# Patient Record
Sex: Female | Born: 1980 | Race: Black or African American | Hispanic: No | Marital: Single | Smoking: Never smoker
Health system: Southern US, Community
[De-identification: ages and names within clinical notes are randomized; demographics above are authoritative.]

## PROBLEM LIST (undated history)

## (undated) DIAGNOSIS — F32A Depression, unspecified: Secondary | ICD-10-CM

## (undated) DIAGNOSIS — N83209 Unspecified ovarian cyst, unspecified side: Secondary | ICD-10-CM

## (undated) DIAGNOSIS — K589 Irritable bowel syndrome without diarrhea: Secondary | ICD-10-CM

## (undated) DIAGNOSIS — J189 Pneumonia, unspecified organism: Secondary | ICD-10-CM

## (undated) DIAGNOSIS — D573 Sickle-cell trait: Secondary | ICD-10-CM

## (undated) DIAGNOSIS — R110 Nausea: Secondary | ICD-10-CM

## (undated) DIAGNOSIS — R1110 Vomiting, unspecified: Secondary | ICD-10-CM

## (undated) DIAGNOSIS — D126 Benign neoplasm of colon, unspecified: Secondary | ICD-10-CM

## (undated) DIAGNOSIS — K2100 Gastro-esophageal reflux disease with esophagitis, without bleeding: Secondary | ICD-10-CM

## (undated) DIAGNOSIS — G8929 Other chronic pain: Secondary | ICD-10-CM

## (undated) HISTORY — DX: Irritable bowel syndrome, unspecified: K58.9

## (undated) HISTORY — PX: COLONOSCOPY: SHX174

## (undated) HISTORY — PX: ESOPHAGOGASTRODUODENOSCOPY: SHX1529

## (undated) HISTORY — PX: OVARIAN CYST SURGERY: SHX726

---

## 2011-06-17 ENCOUNTER — Emergency Department (HOSPITAL_COMMUNITY)
Admission: EM | Admit: 2011-06-17 | Discharge: 2011-06-18 | Disposition: A | Discharge status: Home / Self Care | Payer: Medicaid Other | Attending: Emergency Medicine | Admitting: Emergency Medicine

## 2011-06-17 ENCOUNTER — Encounter: Payer: Self-pay | Admitting: Emergency Medicine

## 2011-06-17 DIAGNOSIS — K089 Disorder of teeth and supporting structures, unspecified: Secondary | ICD-10-CM | POA: Insufficient documentation

## 2011-06-17 DIAGNOSIS — R51 Headache: Secondary | ICD-10-CM | POA: Insufficient documentation

## 2011-06-17 DIAGNOSIS — K047 Periapical abscess without sinus: Secondary | ICD-10-CM

## 2011-06-17 NOTE — ED Notes (Signed)
Pt states she has had a toothache (both upper and lower on the left side) for about 2 weeks.  Toothache is giving her a headache.  Has had facial swelling.  None noted now.  Has tried Motrin, tylenol, orajel with no relief.  Does not have a dentist at the moment.

## 2011-06-18 MED ORDER — PENICILLIN V POTASSIUM 500 MG PO TABS: 500.0000 mg | ORAL_TABLET | Freq: Four times a day (QID) | ORAL | Status: AC

## 2011-06-18 MED ORDER — HYDROCODONE-ACETAMINOPHEN 5-325 MG PO TABS: 1.0000 | ORAL_TABLET | Freq: Four times a day (QID) | ORAL | Status: AC | PRN

## 2011-06-18 NOTE — ED Provider Notes (Signed)
History     CSN: [id number] Arrival date & time: 06/17/2011  8:07 PM   First MD Initiated Contact with Patient 06/17/11 2346      Chief Complaint  Patient presents with  . Dental Pain  . Headache    (Consider location/radiation/quality/duration/timing/severity/associated sxs/prior treatment) HPI Comments: Patient reports left upper and left lower molar pain with associated headache x 2 weeks.  Pain is described as aching, exacerbated palpation.  Reports some mild throat discomfort.  No fevers, difficulty swallowing or breathing.  Pt does not currently have a dentist.    Patient is a 30 y.o. female presenting with tooth pain and headaches. The history is provided by the patient.  Dental PainThe primary symptoms include headaches.   Headache     History reviewed. No pertinent past medical history.  History reviewed. No pertinent past surgical history.  History reviewed. No pertinent family history.  History  Substance Use Topics  . Smoking status: Never Smoker   . Smokeless tobacco: Not on file  . Alcohol Use: No    OB History    Grav Para Term Preterm Abortions TAB SAB Ect Mult Living                  Review of Systems  Neurological: Positive for headaches.  All other systems reviewed and are negative.    Allergies  Review of patient's allergies indicates no known allergies.  Home Medications   Current Outpatient Rx  Name Route Sig Dispense Refill  . ACETAMINOPHEN 325 MG PO TABS Oral Take 650 mg by mouth every 6 (six) hours as needed.      . IBUPROFEN 200 MG PO TABS Oral Take 400 mg by mouth every 6 (six) hours as needed.        BP 129/74  Pulse 102  Temp(Src) 98.3 F (36.8 C) (Oral)  Resp 18  SpO2 100%  Physical Exam  Constitutional: She is oriented to person, place, and time. She appears well-developed and well-nourished.  HENT:  Head: Normocephalic and atraumatic. No trismus in the jaw.  Mouth/Throat: Uvula is midline and oropharynx is clear  and moist. Dental abscesses present. No uvula swelling. No posterior oropharyngeal edema or posterior oropharyngeal erythema.         Left gingiva adjacent to left upper 2nd molar tender to palpation.   Neck: Neck supple.  Pulmonary/Chest: Effort normal and breath sounds normal. No stridor. No respiratory distress. She has no wheezes. She has no rales.  Neurological: She is alert and oriented to person, place, and time.    ED Course  Procedures (including critical care time)  Labs Reviewed - No data to display No results found.   1. Dental abscess       MDM  Patient with dental pain, afebrile, airway patent.  Likely dental abscess.  Dental follow up given.          Dillard Cannon [place], [place] 06/18/11 [phone]

## 2011-06-18 NOTE — ED Provider Notes (Signed)
Medical screening examination/treatment/procedure(s) were performed by non-physician practitioner and as supervising physician I was immediately available for consultation/collaboration.  Katelynd Blauvelt M Anicia Leuthold, MD 06/18/11 0817 

## 2011-08-12 ENCOUNTER — Emergency Department (HOSPITAL_COMMUNITY)
Admission: EM | Admit: 2011-08-12 | Discharge: 2011-08-12 | Disposition: A | Discharge status: Home / Self Care | Payer: Medicaid Other | Attending: Emergency Medicine | Admitting: Emergency Medicine

## 2011-08-12 ENCOUNTER — Encounter (HOSPITAL_COMMUNITY): Payer: Self-pay | Admitting: Emergency Medicine

## 2011-08-12 ENCOUNTER — Other Ambulatory Visit: Payer: Self-pay

## 2011-08-12 DIAGNOSIS — R112 Nausea with vomiting, unspecified: Secondary | ICD-10-CM | POA: Insufficient documentation

## 2011-08-12 DIAGNOSIS — K2970 Gastritis, unspecified, without bleeding: Secondary | ICD-10-CM | POA: Insufficient documentation

## 2011-08-12 DIAGNOSIS — R1013 Epigastric pain: Secondary | ICD-10-CM | POA: Insufficient documentation

## 2011-08-12 DIAGNOSIS — R079 Chest pain, unspecified: Secondary | ICD-10-CM | POA: Insufficient documentation

## 2011-08-12 HISTORY — DX: Unspecified ovarian cyst, unspecified side: N83.209

## 2011-08-12 LAB — COMPREHENSIVE METABOLIC PANEL
ALT: 13 U/L (ref 0–35)
Alkaline Phosphatase: 92 U/L (ref 39–117)
BUN: 6 mg/dL (ref 6–23)
CO2: 20 mEq/L (ref 19–32)
Chloride: 105 mEq/L (ref 96–112)
GFR calc Af Amer: >90 mL/min (ref >90)
Glucose, Bld: 96 mg/dL (ref 70–99)
Potassium: 3.7 mEq/L (ref 3.5–5.1)
Sodium: 137 mEq/L (ref 135–145)
Total Bilirubin: 0.2 mg/dL — ABNORMAL LOW (ref 0.3–1.2)
Total Protein: 8.4 g/dL — ABNORMAL HIGH (ref 6.0–8.3)

## 2011-08-12 LAB — LIPASE, BLOOD: Lipase: 20 U/L (ref 11–59)

## 2011-08-12 MED ORDER — OMEPRAZOLE 20 MG PO CPDR: 20.0000 mg | DELAYED_RELEASE_CAPSULE | Freq: Every day | ORAL | Status: DC

## 2011-08-12 MED ORDER — FAMOTIDINE IN NACL 20-0.9 MG/50ML-% IV SOLN
20.0000 mg | Freq: Once | INTRAVENOUS | Status: AC
  Administered 2011-08-12: 20 mg via INTRAVENOUS
  Filled 2011-08-12: qty 50

## 2011-08-12 MED ORDER — SUCRALFATE 1 GM/10ML PO SUSP: 1.0000 g | Freq: Four times a day (QID) | ORAL | Status: AC | PRN

## 2011-08-12 MED ORDER — SODIUM CHLORIDE 0.9 % IV BOLUS (SEPSIS)
1000.0000 mL | Freq: Once | INTRAVENOUS | Status: AC
  Administered 2011-08-12: 1000 mL via INTRAVENOUS

## 2011-08-12 MED ORDER — GI COCKTAIL ~~LOC~~
30.0000 mL | Freq: Once | ORAL | Status: AC
  Administered 2011-08-12: 30 mL via ORAL
  Filled 2011-08-12: qty 30

## 2011-08-12 NOTE — ED Notes (Signed)
Patient stable upon discharge.  

## 2011-08-12 NOTE — ED Provider Notes (Signed)
History     CSN: [id number]  Arrival date & time 08/12/11  2012   First MD Initiated Contact with Patient 08/12/11 2049      Chief Complaint  Patient presents with  . Chest Pain    (Consider location/radiation/quality/duration/timing/severity/associated sxs/prior treatment) HPI This previously well young female presents with one day of epigastric pain she notes that she was incision noticed pain focally about her epigastrium the pain is nonradiating, burning/sharp she denies any pleuritic pain, any exertional pain she also notes emesis, with post emesis worsening of the pain. no confusion, no disorientation, no diarrhea, no fevers, no chills. She does have a history of heartburn, and notes that this is similar, though worse than prior episodes Past Medical History  Diagnosis Date  . Ovarian cyst     History reviewed. No pertinent past surgical history.  No family history on file.  History  Substance Use Topics  . Smoking status: Never Smoker   . Smokeless tobacco: Not on file  . Alcohol Use: No    OB History    Grav Para Term Preterm Abortions TAB SAB Ect Mult Living                  Review of Systems  Constitutional:       HPI  HENT:       HPI otherwise negative  Eyes: Negative.   Respiratory:       HPI, otherwise negative  Cardiovascular:       HPI, otherwise nmegative  Gastrointestinal: Positive for nausea and vomiting. Negative for diarrhea.  Genitourinary:       HPI, otherwise negative  Musculoskeletal:       HPI, otherwise negative  Skin: Negative.   Neurological: Negative for syncope.    Allergies  Review of patient's allergies indicates no known allergies.  Home Medications   Current Outpatient Rx  Name Route Sig Dispense Refill  . ACETAMINOPHEN 325 MG PO TABS Oral Take 650 mg by mouth every 6 (six) hours as needed.     . IBUPROFEN 200 MG PO TABS Oral Take 400 mg by mouth every 6 (six) hours as needed. Pain    . OMEPRAZOLE 20 MG PO CPDR Oral  Take 1 capsule (20 mg total) by mouth daily. 30 capsule 0  . SUCRALFATE 1 GM/10ML PO SUSP Oral Take 10 mLs (1 g total) by mouth every 6 (six) hours as needed. 420 mL 0    Wt 176 lb (79.833 kg)  LMP 08/05/2011  Physical Exam  Nursing note and vitals reviewed. Constitutional: She is oriented to person, place, and time. She appears well-developed and well-nourished. No distress.  HENT:  Head: Normocephalic and atraumatic.  Eyes: Conjunctivae and EOM are normal.  Cardiovascular: Normal rate and regular rhythm.   Pulmonary/Chest: Effort normal and breath sounds normal. No stridor. No respiratory distress.  Abdominal: She exhibits no distension.  Musculoskeletal: She exhibits no edema.  Neurological: She is alert and oriented to person, place, and time. No cranial nerve deficit.  Skin: Skin is warm and dry.  Psychiatric: She has a normal mood and affect.    ED Course  Procedures (including critical care time)  Labs Reviewed  COMPREHENSIVE METABOLIC PANEL - Abnormal; Notable for the following:    Total Protein 8.4 (*)    Total Bilirubin 0.2 (*)    All other components within normal limits  LIPASE, BLOOD   No results found.   1. Gastritis      Date: 08/12/2011  Rate: 91  Rhythm: normal sinus rhythm  QRS Axis: normal  Intervals: normal  ST/T Wave abnormalities: normal  Conduction Disutrbances:none  Narrative Interpretation:   Old EKG Reviewed: none available NORMAL ECG   MDM  This generally well 31 year old female presents with one day of epigastric pain the patient's description of a burning pain with vomiting, and subsequent worsening of the pain is suggestive of gastric etiology the patient's absence of cardiac risk factors, her generally benign appearance, her unremarkable ECT are all reassuring the patient will be discharged in stable condition to follow up with her primary care physician she was discharged on a PPI        Gerhard Munch, MD 08/12/11 2302

## 2011-08-12 NOTE — ED Notes (Signed)
Pt alert, nad, c/o chest pain, mid sternal, non radiating, worse with experation, resp even unlabored, emesis several times today, skin pwd, states pain same as when she had a cyst on her ovary

## 2011-12-29 ENCOUNTER — Encounter (HOSPITAL_COMMUNITY): Payer: Self-pay | Admitting: *Deleted

## 2011-12-29 ENCOUNTER — Emergency Department (HOSPITAL_COMMUNITY)
Admission: EM | Admit: 2011-12-29 | Discharge: 2011-12-30 | Disposition: A | Discharge status: Home / Self Care | Payer: Self-pay | Attending: Emergency Medicine | Admitting: Emergency Medicine

## 2011-12-29 DIAGNOSIS — M549 Dorsalgia, unspecified: Secondary | ICD-10-CM

## 2011-12-29 DIAGNOSIS — R109 Unspecified abdominal pain: Secondary | ICD-10-CM | POA: Insufficient documentation

## 2011-12-29 DIAGNOSIS — A499 Bacterial infection, unspecified: Secondary | ICD-10-CM | POA: Insufficient documentation

## 2011-12-29 DIAGNOSIS — N760 Acute vaginitis: Secondary | ICD-10-CM | POA: Insufficient documentation

## 2011-12-29 DIAGNOSIS — M545 Low back pain, unspecified: Secondary | ICD-10-CM | POA: Insufficient documentation

## 2011-12-29 DIAGNOSIS — B9689 Other specified bacterial agents as the cause of diseases classified elsewhere: Secondary | ICD-10-CM | POA: Insufficient documentation

## 2011-12-29 LAB — URINE MICROSCOPIC-ADD ON

## 2011-12-29 LAB — WET PREP, GENITAL: Yeast Wet Prep HPF POC: NONE SEEN

## 2011-12-29 LAB — URINALYSIS, ROUTINE W REFLEX MICROSCOPIC
Bilirubin Urine: NEGATIVE
Hgb urine dipstick: NEGATIVE
Ketones, ur: NEGATIVE mg/dL
Nitrite: NEGATIVE
Specific Gravity, Urine: 1.012 (ref 1.005–1.030)
Urobilinogen, UA: 0.2 mg/dL (ref 0.0–1.0)

## 2011-12-29 LAB — GLUCOSE, CAPILLARY: Glucose-Capillary: 90 mg/dL (ref 70–99)

## 2011-12-29 MED ORDER — HYDROCODONE-ACETAMINOPHEN 5-325 MG PO TABS
1.0000 | ORAL_TABLET | Freq: Once | ORAL | Status: AC
  Administered 2011-12-29: 1 via ORAL
  Filled 2011-12-29 (×2): qty 1

## 2011-12-29 MED ORDER — METRONIDAZOLE 500 MG PO TABS: 500.0000 mg | ORAL_TABLET | Freq: Two times a day (BID) | ORAL | Status: AC

## 2011-12-29 MED ORDER — TRAMADOL HCL 50 MG PO TABS: 50.0000 mg | ORAL_TABLET | Freq: Four times a day (QID) | ORAL | Status: AC | PRN

## 2011-12-29 MED ORDER — DIAZEPAM 5 MG PO TABS: 5.0000 mg | ORAL_TABLET | Freq: Three times a day (TID) | ORAL | Status: AC | PRN

## 2011-12-29 MED ORDER — DIAZEPAM 5 MG PO TABS
5.0000 mg | ORAL_TABLET | Freq: Once | ORAL | Status: AC
  Administered 2011-12-29: 5 mg via ORAL
  Filled 2011-12-29: qty 1

## 2011-12-29 NOTE — ED Notes (Signed)
Pt c/o frequent urination with lower back pain since yesterday.  Also is nauseous with no vomiting, lower abdominal pain.

## 2011-12-29 NOTE — Discharge Instructions (Signed)
Your urine cultures are pending. Your pelvic exam showed bacterial vaginosis. Take ibuprofen for pain. Ultram as prescribed as needed for severe pain. Take valium for spasms as prescribed. Try heating pads. Flagyl for infection. Follow up with your doctor. Return if worsening.   Back Pain, Adult Low back pain is very common. About 1 in 5 people have back pain.The cause of low back pain is rarely dangerous. The pain often gets better over time.About half of people with a sudden onset of back pain feel better in just 2 weeks. About 8 in 10 people feel better by 6 weeks.  CAUSES Some common causes of back pain include:  Strain of the muscles or ligaments supporting the spine.   Wear and tear (degeneration) of the spinal discs.   Arthritis.   Direct injury to the back.  DIAGNOSIS Most of the time, the direct cause of low back pain is not known.However, back pain can be treated effectively even when the exact cause of the pain is unknown.Answering your caregiver's questions about your overall health and symptoms is one of the most accurate ways to make sure the cause of your pain is not dangerous. If your caregiver needs more information, he or she may order lab work or imaging tests (X-rays or MRIs).However, even if imaging tests show changes in your back, this usually does not require surgery. HOME CARE INSTRUCTIONS For many people, back pain returns.Since low back pain is rarely dangerous, it is often a condition that people can learn to [facility] their own.   Remain active. It is stressful on the back to sit or stand in one place. Do not sit, drive, or stand in one place for more than 30 minutes at a time. Take short walks on level surfaces as soon as pain allows.Try to increase the length of time you walk each day.   Do not stay in bed.Resting more than 1 or 2 days can delay your recovery.   Do not avoid exercise or work.Your body is made to move.It is not dangerous to be active,  even though your back may hurt.Your back will likely heal faster if you return to being active before your pain is gone.   Pay attention to your body when you bend and lift. Many people have less discomfortwhen lifting if they bend their knees, keep the load close to their bodies,and avoid twisting. Often, the most comfortable positions are those that put less stress on your recovering back.   Find a comfortable position to sleep. Use a firm mattress and lie on your side with your knees slightly bent. If you lie on your back, put a pillow under your knees.   Only take over-the-counter or prescription medicines as directed by your caregiver. Over-the-counter medicines to reduce pain and inflammation are often the most helpful.Your caregiver may prescribe muscle relaxant drugs.These medicines help dull your pain so you can more quickly return to your normal activities and healthy exercise.   Put ice on the injured area.   Put ice in a plastic bag.   Place a towel between your skin and the bag.   Leave the ice on for 15 to 20 minutes, 3 to 4 times a day for the first 2 to 3 days. After that, ice and heat may be alternated to reduce pain and spasms.   Ask your caregiver about trying back exercises and gentle massage. This may be of some benefit.   Avoid feeling anxious or stressed.Stress increases muscle tension and  can worsen back pain.It is important to recognize when you are anxious or stressed and learn ways to manage it.Exercise is a great option.  SEEK MEDICAL CARE IF:  You have pain that is not relieved with rest or medicine.   You have pain that does not improve in 1 week.   You have new symptoms.   You are generally not feeling well.  SEEK IMMEDIATE MEDICAL CARE IF:   You have pain that radiates from your back into your legs.   You develop new bowel or bladder control problems.   You have unusual weakness or numbness in your arms or legs.   You develop nausea or  vomiting.   You develop abdominal pain.   You feel faint.  Document Released: 07/06/2005 Document Revised: 06/25/2011 Document Reviewed: 11/24/2010 [facility] Patient Information 2012 [place], [place].  Bacterial Vaginosis Bacterial vaginosis (BV) is a vaginal infection where the normal balance of bacteria in the vagina is disrupted. The normal balance is then replaced by an overgrowth of certain bacteria. There are several different kinds of bacteria that can cause BV. BV is the most common vaginal infection in women of childbearing age. CAUSES   The cause of BV is not fully understood. BV develops when there is an increase or imbalance of harmful bacteria.   Some activities or behaviors can upset the normal balance of bacteria in the vagina and put women at increased risk including:   Having a new sex partner or multiple sex partners.   Douching.   Using an intrauterine device (IUD) for contraception.   It is not clear what role sexual activity plays in the development of BV. However, women that have never had sexual intercourse are rarely infected with BV.  Women do not get BV from toilet seats, bedding, swimming pools or from touching objects around them.  SYMPTOMS   Grey vaginal discharge.   A fish-like odor with discharge, especially after sexual intercourse.   Itching or burning of the vagina and vulva.   Burning or pain with urination.   Some women have no signs or symptoms at all.  DIAGNOSIS  Your caregiver must examine the vagina for signs of BV. Your caregiver will perform lab tests and look at the sample of vaginal fluid through a microscope. They will look for bacteria and abnormal cells (clue cells), a pH test higher than 4.5, and a positive amine test all associated with BV.  RISKS AND COMPLICATIONS   Pelvic inflammatory disease (PID).   Infections following gynecology surgery.   Developing HIV.   Developing herpes virus.  TREATMENT  Sometimes BV will clear  up without treatment. However, all women with symptoms of BV should be treated to avoid complications, especially if gynecology surgery is planned. Female partners generally do not need to be treated. However, BV may spread between female sex partners so treatment is helpful in preventing a recurrence of BV.   BV may be treated with antibiotics. The antibiotics come in either pill or vaginal cream forms. Either can be used with nonpregnant or pregnant women, but the recommended dosages differ. These antibiotics are not harmful to the baby.   BV can recur after treatment. If this happens, a second round of antibiotics will often be prescribed.   Treatment is important for pregnant women. If not treated, BV can cause a premature delivery, especially for a pregnant woman who had a premature birth in the past. All pregnant women who have symptoms of BV should be checked and  treated.   For chronic reoccurrence of BV, treatment with a type of prescribed gel vaginally twice a week is helpful.  HOME CARE INSTRUCTIONS   Finish all medication as directed by your caregiver.   Do not have sex until treatment is completed.   Tell your sexual partner that you have a vaginal infection. They should see their caregiver and be treated if they have problems, such as a mild rash or itching.   Practice safe sex. Use condoms. Only have 1 sex partner.  PREVENTION  Basic prevention steps can help reduce the risk of upsetting the natural balance of bacteria in the vagina and developing BV:  Do not have sexual intercourse (be abstinent).   Do not douche.   Use all of the medicine prescribed for treatment of BV, even if the signs and symptoms go away.   Tell your sex partner if you have BV. That way, they can be treated, if needed, to prevent reoccurrence.  SEEK MEDICAL CARE IF:   Your symptoms are not improving after 3 days of treatment.   You have increased discharge, pain, or fever.  MAKE SURE YOU:    Understand these instructions.   Will watch your condition.   Will get help right away if you are not doing well or get worse.  FOR MORE INFORMATION  Division of STD Prevention (DSTDP), Centers for Disease Control and Prevention: [employer] American Social Health Association (ASHA): www.ashastd.org  Document Released: 07/06/2005 Document Revised: 06/25/2011 Document Reviewed: 12/27/2008 [facility] Patient Information 2012 [place], [place].

## 2011-12-29 NOTE — ED Provider Notes (Signed)
History     CSN: [id number]  Arrival date & time 12/29/11  1913   First MD Initiated Contact with Patient 12/29/11 2154      Chief Complaint  Patient presents with  . Abdominal Pain    (Consider location/radiation/quality/duration/timing/severity/associated sxs/prior treatment) Patient is a 31 y.o. female presenting with abdominal pain. The history is provided by the patient.  Abdominal Pain The primary symptoms of the illness include abdominal pain. The primary symptoms of the illness do not include fever, nausea, vomiting, diarrhea, dysuria, vaginal discharge or vaginal bleeding. The current episode started yesterday. The problem has been gradually worsening.  The patient states that she believes she is currently not pregnant. The patient has not had a change in bowel habit. Additional symptoms associated with the illness include urgency, frequency and back pain. Symptoms associated with the illness do not include chills, anorexia, constipation or hematuria.  Pt states pain in lower abdomen and lower back onset yesterday. Worsened with movement and walking. States urinary frequency but no dysuria or vaginal discharge. Denies fever, chills, malaise.   Past Medical History  Diagnosis Date  . Ovarian cyst     History reviewed. No pertinent past surgical history.  No family history on file.  History  Substance Use Topics  . Smoking status: Never Smoker   . Smokeless tobacco: Not on file  . Alcohol Use: No    OB History    Grav Para Term Preterm Abortions TAB SAB Ect Mult Living                  Review of Systems  Constitutional: Negative for fever and chills.  Respiratory: Negative.   Cardiovascular: Negative.   Gastrointestinal: Positive for abdominal pain. Negative for nausea, vomiting, diarrhea, constipation and anorexia.  Genitourinary: Positive for urgency and frequency. Negative for dysuria, hematuria, vaginal bleeding and vaginal discharge.  Musculoskeletal:  Positive for back pain.  Skin: Negative.   Neurological: Positive for headaches. Negative for dizziness and weakness.    Allergies  Review of patient's allergies indicates no known allergies.  Home Medications   Current Outpatient Rx  Name Route Sig Dispense Refill  . ACETAMINOPHEN 325 MG PO TABS Oral Take 650 mg by mouth every 6 (six) hours as needed.     . IBUPROFEN 200 MG PO TABS Oral Take 400 mg by mouth every 6 (six) hours as needed. Pain      BP 121/72  Pulse 101  Temp(Src) 98.9 F (37.2 C) (Oral)  Resp 19  SpO2 100%  LMP 11/28/2011  Physical Exam  Nursing note and vitals reviewed. Constitutional: She is oriented to person, place, and time. She appears well-developed and well-nourished. No distress.  HENT:  Head: Normocephalic.  Eyes: Conjunctivae are normal.  Cardiovascular: Normal rate, regular rhythm and normal heart sounds.   Pulmonary/Chest: Effort normal and breath sounds normal. No respiratory distress. She has no wheezes. She has no rales.  Abdominal: Soft. Bowel sounds are normal.       Suprapubic tenderness, no CVA tenderness  Genitourinary: Vagina normal and uterus normal. Uterus is not tender. Cervix exhibits discharge. Cervix exhibits no motion tenderness. Right adnexum displays no mass and no tenderness. Left adnexum displays no mass and no tenderness. No erythema or tenderness around the vagina.  Musculoskeletal:       Tender over midline and para vertebral lumbar spine. No bruising or swelling noted  Neurological: She is alert and oriented to person, place, and time. She displays normal reflexes. She exhibits  normal muscle tone. Coordination normal.  Skin: Skin is warm and dry.  Psychiatric: She has a normal mood and affect.    ED Course  Procedures (including critical care time)  Pt with lower abdominal pain and lower back pain. No fever, n/v, no problems with bowels. Abdomen soft. No guarding. No peritoneal signs, doubt appendicitis/ colitis. WIll  do pelvic exam. UA pending.  Results for orders placed during the hospital encounter of 12/29/11  URINALYSIS, ROUTINE W REFLEX MICROSCOPIC      Component Value Range   Color, Urine YELLOW  YELLOW    APPearance HAZY (*) CLEAR    Specific Gravity, Urine 1.012  1.005 - 1.030    pH 6.0  5.0 - 8.0    Glucose, UA NEGATIVE  NEGATIVE (mg/dL)   Hgb urine dipstick NEGATIVE  NEGATIVE    Bilirubin Urine NEGATIVE  NEGATIVE    Ketones, ur NEGATIVE  NEGATIVE (mg/dL)   Protein, ur NEGATIVE  NEGATIVE (mg/dL)   Urobilinogen, UA 0.2  0.0 - 1.0 (mg/dL)   Nitrite NEGATIVE  NEGATIVE    Leukocytes, UA SMALL (*) NEGATIVE   PREGNANCY, URINE      Component Value Range   Preg Test, Ur NEGATIVE  NEGATIVE   URINE MICROSCOPIC-ADD ON      Component Value Range   Squamous Epithelial / LPF MANY (*) RARE    WBC, UA 3-6  <3 (WBC/hpf)   Bacteria, UA FEW (*) RARE   WET PREP, GENITAL      Component Value Range   Yeast Wet Prep HPF POC NONE SEEN  NONE SEEN    Trich, Wet Prep NONE SEEN  NONE SEEN    Clue Cells Wet Prep HPF POC MODERATE (*) NONE SEEN    WBC, Wet Prep HPF POC FEW (*) NONE SEEN   GLUCOSE, CAPILLARY      Component Value Range   Glucose-Capillary 90  70 - 99 (mg/dL)   16:10 PM Urine cultures sent. Will treat for bacterial vaginosis. Muscle relaxant for spasms. Follow up with pcp.   1. Bacterial vaginosis   2. Back pain       MDM          Lottie Mussel, PA 12/31/11 0122

## 2011-12-29 NOTE — ED Notes (Signed)
abd pain and lower back pain since yesterday.  Headache urinary frequency.  lmp last month

## 2011-12-29 NOTE — ED Notes (Signed)
Rx x 3.  Pt voiced understanding to f/u with PCP and return for worsening condition.  

## 2011-12-31 LAB — URINE CULTURE
Colony Count: >=100000
Culture  Setup Time: 201306112250

## 2011-12-31 LAB — GC/CHLAMYDIA PROBE AMP, GENITAL
Chlamydia, DNA Probe: NEGATIVE
GC Probe Amp, Genital: NEGATIVE

## 2012-01-01 NOTE — ED Provider Notes (Signed)
Medical screening examination/treatment/procedure(s) were performed by non-physician practitioner and as supervising physician I was immediately available for consultation/collaboration.  Jelani Vreeland R. Bj Morlock, MD 01/01/12 0741 

## 2012-01-01 NOTE — ED Notes (Signed)
Chart sent to EDP office for review. Chart returned from EDP office. Return for worsening symptoms. No abx needed at this time. Reviewed by Trixie Dredge PA-C.

## 2012-10-11 ENCOUNTER — Inpatient Hospital Stay (HOSPITAL_COMMUNITY): Payer: Medicaid Other | Admitting: Anesthesiology

## 2012-10-11 ENCOUNTER — Encounter (HOSPITAL_COMMUNITY): Payer: Self-pay

## 2012-10-11 ENCOUNTER — Inpatient Hospital Stay (HOSPITAL_COMMUNITY)
Admission: AD | Admit: 2012-10-11 | Discharge: 2012-10-13 | DRG: 777 | Disposition: A | Discharge status: Home / Self Care | Payer: Medicaid Other | Source: Ambulatory Visit | Attending: Obstetrics & Gynecology | Admitting: Obstetrics & Gynecology

## 2012-10-11 ENCOUNTER — Inpatient Hospital Stay (HOSPITAL_COMMUNITY): Payer: Medicaid Other

## 2012-10-11 ENCOUNTER — Encounter (HOSPITAL_COMMUNITY): Payer: Self-pay | Admitting: Anesthesiology

## 2012-10-11 ENCOUNTER — Encounter (HOSPITAL_COMMUNITY)
Admission: AD | Disposition: A | Discharge status: Home / Self Care | Payer: Self-pay | Source: Ambulatory Visit | Attending: Obstetrics & Gynecology

## 2012-10-11 DIAGNOSIS — O008 Other ectopic pregnancy without intrauterine pregnancy: Secondary | ICD-10-CM

## 2012-10-11 DIAGNOSIS — K661 Hemoperitoneum: Secondary | ICD-10-CM | POA: Diagnosis present

## 2012-10-11 DIAGNOSIS — N736 Female pelvic peritoneal adhesions (postinfective): Secondary | ICD-10-CM

## 2012-10-11 DIAGNOSIS — N949 Unspecified condition associated with female genital organs and menstrual cycle: Secondary | ICD-10-CM | POA: Diagnosis present

## 2012-10-11 DIAGNOSIS — Z5331 Laparoscopic surgical procedure converted to open procedure: Secondary | ICD-10-CM

## 2012-10-11 DIAGNOSIS — R109 Unspecified abdominal pain: Secondary | ICD-10-CM | POA: Diagnosis present

## 2012-10-11 DIAGNOSIS — O00109 Unspecified tubal pregnancy without intrauterine pregnancy: Principal | ICD-10-CM | POA: Diagnosis present

## 2012-10-11 HISTORY — PX: UNILATERAL SALPINGECTOMY: SHX6160

## 2012-10-11 HISTORY — PX: LYSIS OF ADHESION: SHX5961

## 2012-10-11 HISTORY — PX: LAPAROSCOPY: SHX197

## 2012-10-11 HISTORY — PX: LAPAROTOMY: SHX154

## 2012-10-11 HISTORY — DX: Sickle-cell trait: D57.3

## 2012-10-11 LAB — CBC
MCHC: 33.5 g/dL (ref 30.0–36.0)
RDW: 15.3 % (ref 11.5–15.5)
WBC: 11.5 10*3/uL — ABNORMAL HIGH (ref 4.0–10.5)

## 2012-10-11 LAB — ABO/RH: ABO/RH(D): AB POS

## 2012-10-11 SURGERY — LAPAROSCOPY OPERATIVE
Anesthesia: General | Site: Abdomen | Laterality: Right | Wound class: Clean Contaminated

## 2012-10-11 MED ORDER — ONDANSETRON HCL 4 MG/2ML IJ SOLN
4.0000 mg | Freq: Four times a day (QID) | INTRAMUSCULAR | Status: DC | PRN
  Filled 2012-10-11: qty 2

## 2012-10-11 MED ORDER — ROCURONIUM BROMIDE 50 MG/5ML IV SOLN
INTRAVENOUS | Status: AC
  Filled 2012-10-11: qty 1

## 2012-10-11 MED ORDER — HYDROMORPHONE HCL PF 1 MG/ML IJ SOLN
1.0000 mg | Freq: Once | INTRAMUSCULAR | Status: DC
  Filled 2012-10-11: qty 1

## 2012-10-11 MED ORDER — ACETAMINOPHEN 325 MG PO TABS: 650.0000 mg | ORAL_TABLET | ORAL | Status: DC | PRN

## 2012-10-11 MED ORDER — PHENYLEPHRINE 40 MCG/ML (10ML) SYRINGE FOR IV PUSH (FOR BLOOD PRESSURE SUPPORT)
PREFILLED_SYRINGE | INTRAVENOUS | Status: AC
  Filled 2012-10-11: qty 5

## 2012-10-11 MED ORDER — SUCCINYLCHOLINE CHLORIDE 20 MG/ML IJ SOLN
INTRAMUSCULAR | Status: AC
  Filled 2012-10-11: qty 10

## 2012-10-11 MED ORDER — 0.9 % SODIUM CHLORIDE (POUR BTL) OPTIME
TOPICAL | Status: DC | PRN
  Administered 2012-10-11: 1000 mL

## 2012-10-11 MED ORDER — ONDANSETRON HCL 4 MG/2ML IJ SOLN: 4.0000 mg | Freq: Four times a day (QID) | INTRAMUSCULAR | Status: DC | PRN

## 2012-10-11 MED ORDER — DEXAMETHASONE SODIUM PHOSPHATE 10 MG/ML IJ SOLN
INTRAMUSCULAR | Status: AC
  Filled 2012-10-11: qty 1

## 2012-10-11 MED ORDER — CEFAZOLIN SODIUM-DEXTROSE 2-3 GM-% IV SOLR
INTRAVENOUS | Status: DC | PRN
  Administered 2012-10-11: 2 g via INTRAVENOUS

## 2012-10-11 MED ORDER — SODIUM CHLORIDE 0.9 % IJ SOLN: 9.0000 mL | INTRAMUSCULAR | Status: DC | PRN

## 2012-10-11 MED ORDER — MIDAZOLAM HCL 5 MG/5ML IJ SOLN
INTRAMUSCULAR | Status: DC | PRN
  Administered 2012-10-11: 3 mg via INTRAVENOUS
  Administered 2012-10-11: 2 mg via INTRAVENOUS

## 2012-10-11 MED ORDER — OXYCODONE-ACETAMINOPHEN 5-325 MG PO TABS
1.0000 | ORAL_TABLET | ORAL | Status: DC | PRN
  Administered 2012-10-12 – 2012-10-13 (×4): 2 via ORAL

## 2012-10-11 MED ORDER — HYDROMORPHONE 0.3 MG/ML IV SOLN
INTRAVENOUS | Status: DC
  Administered 2012-10-11: 10:00:00 via INTRAVENOUS
  Filled 2012-10-11: qty 25

## 2012-10-11 MED ORDER — DIPHENHYDRAMINE HCL 12.5 MG/5ML PO ELIX
12.5000 mg | ORAL_SOLUTION | Freq: Four times a day (QID) | ORAL | Status: DC | PRN
  Filled 2012-10-11 (×3): qty 5

## 2012-10-11 MED ORDER — SODIUM CHLORIDE 0.9 % IJ SOLN
INTRAMUSCULAR | Status: DC | PRN
  Administered 2012-10-11: 10 mL

## 2012-10-11 MED ORDER — DEXTROSE 5 % IN LACTATED RINGERS IV BOLUS
1000.0000 mL | Freq: Once | INTRAVENOUS | Status: AC
  Administered 2012-10-11: 1000 mL via INTRAVENOUS

## 2012-10-11 MED ORDER — KETOROLAC TROMETHAMINE 30 MG/ML IJ SOLN
30.0000 mg | Freq: Four times a day (QID) | INTRAMUSCULAR | Status: DC
  Administered 2012-10-11 – 2012-10-12 (×4): 30 mg via INTRAVENOUS
  Filled 2012-10-11 (×3): qty 1

## 2012-10-11 MED ORDER — ONDANSETRON HCL 4 MG/2ML IJ SOLN
4.0000 mg | Freq: Four times a day (QID) | INTRAMUSCULAR | Status: DC | PRN
  Administered 2012-10-11: 4 mg via INTRAVENOUS

## 2012-10-11 MED ORDER — KETOROLAC TROMETHAMINE 30 MG/ML IJ SOLN
30.0000 mg | Freq: Four times a day (QID) | INTRAMUSCULAR | Status: DC
  Filled 2012-10-11: qty 1

## 2012-10-11 MED ORDER — HYDROMORPHONE HCL PF 1 MG/ML IJ SOLN
1.0000 mg | Freq: Once | INTRAMUSCULAR | Status: AC
  Administered 2012-10-11: 1 mg via INTRAVENOUS

## 2012-10-11 MED ORDER — FENTANYL CITRATE 0.05 MG/ML IJ SOLN
INTRAMUSCULAR | Status: AC
  Filled 2012-10-11: qty 2

## 2012-10-11 MED ORDER — ONDANSETRON HCL 4 MG/2ML IJ SOLN
INTRAMUSCULAR | Status: DC | PRN
  Administered 2012-10-11: 4 mg via INTRAVENOUS

## 2012-10-11 MED ORDER — SIMETHICONE 80 MG PO CHEW: 80.0000 mg | CHEWABLE_TABLET | Freq: Four times a day (QID) | ORAL | Status: DC | PRN

## 2012-10-11 MED ORDER — DIPHENHYDRAMINE HCL 12.5 MG/5ML PO ELIX
12.5000 mg | ORAL_SOLUTION | Freq: Four times a day (QID) | ORAL | Status: DC | PRN
  Administered 2012-10-11 – 2012-10-12 (×3): 12.5 mg via ORAL

## 2012-10-11 MED ORDER — HYDROMORPHONE 0.3 MG/ML IV SOLN
INTRAVENOUS | Status: DC
  Administered 2012-10-11: 8 mL via INTRAVENOUS
  Administered 2012-10-11: 2.8 mg via INTRAVENOUS
  Administered 2012-10-11: 2 mL via INTRAVENOUS
  Administered 2012-10-12: 1.19 mg via INTRAVENOUS

## 2012-10-11 MED ORDER — MENTHOL 3 MG MT LOZG: 1.0000 | LOZENGE | OROMUCOSAL | Status: DC | PRN

## 2012-10-11 MED ORDER — OXYCODONE-ACETAMINOPHEN 5-325 MG PO TABS
1.0000 | ORAL_TABLET | ORAL | Status: DC | PRN
  Administered 2012-10-13: 1 via ORAL
  Filled 2012-10-11 (×5): qty 2

## 2012-10-11 MED ORDER — PROPOFOL 10 MG/ML IV EMUL
INTRAVENOUS | Status: DC | PRN
  Administered 2012-10-11: 200 mg via INTRAVENOUS

## 2012-10-11 MED ORDER — LACTATED RINGERS IV BOLUS (SEPSIS): 1000.0000 mL | Freq: Once | INTRAVENOUS | Status: DC

## 2012-10-11 MED ORDER — FENTANYL CITRATE 0.05 MG/ML IJ SOLN
INTRAMUSCULAR | Status: AC
  Filled 2012-10-11: qty 5

## 2012-10-11 MED ORDER — ONDANSETRON HCL 4 MG PO TABS: 4.0000 mg | ORAL_TABLET | Freq: Four times a day (QID) | ORAL | Status: DC | PRN

## 2012-10-11 MED ORDER — SUCCINYLCHOLINE CHLORIDE 20 MG/ML IJ SOLN
INTRAMUSCULAR | Status: DC | PRN
  Administered 2012-10-11: 140 mg via INTRAVENOUS

## 2012-10-11 MED ORDER — FENTANYL CITRATE 0.05 MG/ML IJ SOLN
INTRAMUSCULAR | Status: DC | PRN
  Administered 2012-10-11 (×3): 100 ug via INTRAVENOUS
  Administered 2012-10-11: 50 ug via INTRAVENOUS
  Administered 2012-10-11: 100 ug via INTRAVENOUS

## 2012-10-11 MED ORDER — DIPHENHYDRAMINE HCL 50 MG/ML IJ SOLN: 12.5000 mg | Freq: Four times a day (QID) | INTRAMUSCULAR | Status: DC | PRN

## 2012-10-11 MED ORDER — MIDAZOLAM HCL 2 MG/2ML IJ SOLN
INTRAMUSCULAR | Status: AC
  Filled 2012-10-11: qty 2

## 2012-10-11 MED ORDER — LIDOCAINE HCL (CARDIAC) 20 MG/ML IV SOLN
INTRAVENOUS | Status: AC
  Filled 2012-10-11: qty 5

## 2012-10-11 MED ORDER — ZOLPIDEM TARTRATE 5 MG PO TABS
5.0000 mg | ORAL_TABLET | Freq: Every evening | ORAL | Status: DC | PRN
  Administered 2012-10-12: 5 mg via ORAL
  Filled 2012-10-11: qty 1

## 2012-10-11 MED ORDER — GLYCOPYRROLATE 0.2 MG/ML IJ SOLN
INTRAMUSCULAR | Status: AC
  Filled 2012-10-11: qty 4

## 2012-10-11 MED ORDER — CEFAZOLIN SODIUM-DEXTROSE 2-3 GM-% IV SOLR
INTRAVENOUS | Status: AC
  Filled 2012-10-11: qty 50

## 2012-10-11 MED ORDER — PHENYLEPHRINE HCL 10 MG/ML IJ SOLN
INTRAMUSCULAR | Status: DC | PRN
  Administered 2012-10-11: 40 ug via INTRAVENOUS
  Administered 2012-10-11: 80 ug via INTRAVENOUS
  Administered 2012-10-11 (×2): 40 ug via INTRAVENOUS

## 2012-10-11 MED ORDER — LACTATED RINGERS IV SOLN
INTRAVENOUS | Status: DC
  Administered 2012-10-11 – 2012-10-12 (×3): via INTRAVENOUS

## 2012-10-11 MED ORDER — CITRIC ACID-SODIUM CITRATE 334-500 MG/5ML PO SOLN
30.0000 mL | Freq: Once | ORAL | Status: AC
  Administered 2012-10-11: 30 mL via ORAL
  Filled 2012-10-11: qty 15

## 2012-10-11 MED ORDER — HYDROMORPHONE HCL PF 1 MG/ML IJ SOLN
INTRAMUSCULAR | Status: AC
  Administered 2012-10-11: 0.5 mg via INTRAVENOUS
  Filled 2012-10-11: qty 1

## 2012-10-11 MED ORDER — HYDROMORPHONE HCL PF 1 MG/ML IJ SOLN
INTRAMUSCULAR | Status: DC | PRN
  Administered 2012-10-11 (×2): .5 mg via INTRAVENOUS

## 2012-10-11 MED ORDER — DOCUSATE SODIUM 100 MG PO CAPS
100.0000 mg | ORAL_CAPSULE | Freq: Two times a day (BID) | ORAL | Status: DC
  Administered 2012-10-11 – 2012-10-12 (×4): 100 mg via ORAL
  Filled 2012-10-11 (×4): qty 1

## 2012-10-11 MED ORDER — NALOXONE HCL 0.4 MG/ML IJ SOLN: 0.4000 mg | INTRAMUSCULAR | Status: DC | PRN

## 2012-10-11 MED ORDER — BISACODYL 5 MG PO TBEC
5.0000 mg | DELAYED_RELEASE_TABLET | Freq: Every day | ORAL | Status: DC | PRN
  Filled 2012-10-11: qty 1

## 2012-10-11 MED ORDER — FAMOTIDINE IN NACL 20-0.9 MG/50ML-% IV SOLN
20.0000 mg | Freq: Once | INTRAVENOUS | Status: AC
  Administered 2012-10-11: 20 mg via INTRAVENOUS
  Filled 2012-10-11: qty 50

## 2012-10-11 MED ORDER — ONDANSETRON HCL 4 MG/2ML IJ SOLN
INTRAMUSCULAR | Status: AC
  Filled 2012-10-11: qty 2

## 2012-10-11 MED ORDER — LACTATED RINGERS IR SOLN
Status: DC | PRN
  Administered 2012-10-11: 3000 mL

## 2012-10-11 MED ORDER — GLYCOPYRROLATE 0.2 MG/ML IJ SOLN
INTRAMUSCULAR | Status: DC | PRN
  Administered 2012-10-11: .8 mg via INTRAVENOUS

## 2012-10-11 MED ORDER — ONDANSETRON HCL 4 MG/2ML IJ SOLN
4.0000 mg | Freq: Once | INTRAMUSCULAR | Status: AC
  Administered 2012-10-11: 4 mg via INTRAVENOUS
  Filled 2012-10-11: qty 2

## 2012-10-11 MED ORDER — INFLUENZA VIRUS VACC SPLIT PF IM SUSP
0.5000 mL | INTRAMUSCULAR | Status: AC
  Administered 2012-10-12: 0.5 mL via INTRAMUSCULAR
  Filled 2012-10-11 (×2): qty 0.5

## 2012-10-11 MED ORDER — DEXAMETHASONE SODIUM PHOSPHATE 4 MG/ML IJ SOLN
INTRAMUSCULAR | Status: DC | PRN
  Administered 2012-10-11: 10 mg via INTRAVENOUS

## 2012-10-11 MED ORDER — BUPIVACAINE HCL (PF) 0.25 % IJ SOLN
INTRAMUSCULAR | Status: DC | PRN
  Administered 2012-10-11: 20 mL

## 2012-10-11 MED ORDER — HYDROMORPHONE HCL PF 1 MG/ML IJ SOLN
INTRAMUSCULAR | Status: AC
  Filled 2012-10-11: qty 1

## 2012-10-11 MED ORDER — PROPOFOL 10 MG/ML IV EMUL
INTRAVENOUS | Status: AC
  Filled 2012-10-11: qty 20

## 2012-10-11 MED ORDER — IBUPROFEN 800 MG PO TABS
800.0000 mg | ORAL_TABLET | Freq: Three times a day (TID) | ORAL | Status: DC | PRN
  Administered 2012-10-12 – 2012-10-13 (×3): 800 mg via ORAL
  Filled 2012-10-11 (×3): qty 1

## 2012-10-11 MED ORDER — NEOSTIGMINE METHYLSULFATE 1 MG/ML IJ SOLN
INTRAMUSCULAR | Status: AC
  Filled 2012-10-11: qty 1

## 2012-10-11 MED ORDER — LIDOCAINE HCL (CARDIAC) 20 MG/ML IV SOLN
INTRAVENOUS | Status: DC | PRN
  Administered 2012-10-11: 50 mg via INTRAVENOUS

## 2012-10-11 MED ORDER — METOCLOPRAMIDE HCL 5 MG/ML IJ SOLN: 10.0000 mg | Freq: Once | INTRAMUSCULAR | Status: DC | PRN

## 2012-10-11 MED ORDER — ROCURONIUM BROMIDE 100 MG/10ML IV SOLN
INTRAVENOUS | Status: DC | PRN
  Administered 2012-10-11 (×2): 10 mg via INTRAVENOUS
  Administered 2012-10-11: 5 mg via INTRAVENOUS

## 2012-10-11 MED ORDER — LACTATED RINGERS IV SOLN
INTRAVENOUS | Status: DC | PRN
  Administered 2012-10-11 (×3): via INTRAVENOUS

## 2012-10-11 MED ORDER — NEOSTIGMINE METHYLSULFATE 1 MG/ML IJ SOLN
INTRAMUSCULAR | Status: DC | PRN
  Administered 2012-10-11: 5 mg via INTRAVENOUS

## 2012-10-11 MED ORDER — HYDROMORPHONE HCL PF 1 MG/ML IJ SOLN
0.2500 mg | INTRAMUSCULAR | Status: DC | PRN
  Administered 2012-10-11: 0.5 mg via INTRAVENOUS

## 2012-10-11 MED ORDER — MEPERIDINE HCL 25 MG/ML IJ SOLN: 6.2500 mg | INTRAMUSCULAR | Status: DC | PRN

## 2012-10-11 SURGICAL SUPPLY — 49 items
APPLICATOR COTTON TIP 6IN STRL (MISCELLANEOUS) ×3 IMPLANT
BENZOIN TINCTURE PRP APPL 2/3 (GAUZE/BANDAGES/DRESSINGS) ×3 IMPLANT
BLADE SURG 10 STRL SS (BLADE) ×3 IMPLANT
BLADE SURG 15 STRL LF C SS BP (BLADE) ×2 IMPLANT
BLADE SURG 15 STRL SS (BLADE) ×1
CHLORAPREP W/TINT 26ML (MISCELLANEOUS) ×3 IMPLANT
CLOTH BEACON ORANGE TIMEOUT ST (SAFETY) ×3 IMPLANT
COVER MAYO STAND STRL (DRAPES) ×3 IMPLANT
COVER TABLE BACK 60X90 (DRAPES) ×3 IMPLANT
DRSG COVADERM PLUS 2X2 (GAUZE/BANDAGES/DRESSINGS) ×3 IMPLANT
DRSG OPSITE POSTOP 4X10 (GAUZE/BANDAGES/DRESSINGS) ×3 IMPLANT
GLOVE BIO SURGEON STRL SZ7 (GLOVE) ×9 IMPLANT
GLOVE BIOGEL PI IND STRL 7.0 (GLOVE) ×8 IMPLANT
GLOVE BIOGEL PI INDICATOR 7.0 (GLOVE) ×4
GLOVE NEODERM STER SZ 7 (GLOVE) ×9 IMPLANT
GOWN PREVENTION PLUS LG XLONG (DISPOSABLE) ×6 IMPLANT
GOWN STRL REIN XL XLG (GOWN DISPOSABLE) ×6 IMPLANT
HEMOSTAT SURGICEL 2X14 (HEMOSTASIS) ×3 IMPLANT
NEEDLE INSUFFLATION 120MM (ENDOMECHANICALS) ×3 IMPLANT
NS IRRIG 1000ML POUR BTL (IV SOLUTION) ×12 IMPLANT
PACK LAPAROSCOPY BASIN (CUSTOM PROCEDURE TRAY) ×3 IMPLANT
PENCIL BUTTON HOLSTER BLD 10FT (ELECTRODE) ×3 IMPLANT
POUCH SPECIMEN RETRIEVAL 10MM (ENDOMECHANICALS) IMPLANT
PROTECTOR NERVE ULNAR (MISCELLANEOUS) ×6 IMPLANT
SCALPEL HARMONIC ACE (MISCELLANEOUS) IMPLANT
SET IRRIG TUBING LAPAROSCOPIC (IRRIGATION / IRRIGATOR) ×3 IMPLANT
SPONGE LAP 18X18 X RAY DECT (DISPOSABLE) ×15 IMPLANT
STRIP CLOSURE SKIN 1/2X4 (GAUZE/BANDAGES/DRESSINGS) ×6 IMPLANT
SUT PDS AB 0 CTX 60 (SUTURE) ×3 IMPLANT
SUT PLAIN 2 0 XLH (SUTURE) ×3 IMPLANT
SUT VIC AB 0 CT1 27 (SUTURE) ×1
SUT VIC AB 0 CT1 27XCR 8 STRN (SUTURE) ×2 IMPLANT
SUT VIC AB 0 CTX 36 (SUTURE) ×2
SUT VIC AB 0 CTX36XBRD ANBCTRL (SUTURE) ×4 IMPLANT
SUT VIC AB 2-0 SH 27 (SUTURE) ×2
SUT VIC AB 2-0 SH 27XBRD (SUTURE) ×4 IMPLANT
SUT VIC AB 3-0 PS2 18 (SUTURE) ×2
SUT VIC AB 3-0 PS2 18XBRD (SUTURE) ×4 IMPLANT
SUT VIC AB 4-0 KS 27 (SUTURE) ×3 IMPLANT
SUT VICRYL 0 UR6 27IN ABS (SUTURE) ×9 IMPLANT
SUT VICRYL 4-0 PS2 18IN ABS (SUTURE) ×9 IMPLANT
TOWEL OR 17X24 6PK STRL BLUE (TOWEL DISPOSABLE) ×15 IMPLANT
TRAY FOLEY CATH 14FR (SET/KITS/TRAYS/PACK) ×3 IMPLANT
TROCAR BALLN 12MMX100 BLUNT (TROCAR) ×3 IMPLANT
TROCAR XCEL NON-BLD 11X100MML (ENDOMECHANICALS) ×3 IMPLANT
TROCAR XCEL NON-BLD 5MMX100MML (ENDOMECHANICALS) ×6 IMPLANT
TUBING CONNECTING 10 (TUBING) ×3 IMPLANT
WATER STERILE IRR 1000ML POUR (IV SOLUTION) ×3 IMPLANT
YANKAUER SUCT BULB TIP NO VENT (SUCTIONS) ×3 IMPLANT

## 2012-10-11 NOTE — OR Nursing (Signed)
Exploratory Laparotomy start time 0705.

## 2012-10-11 NOTE — Transfer of Care (Signed)
Immediate Anesthesia Transfer of Care Note  Patient: Amy Heath  Procedure(s) Performed: Procedure(s): LAPAROSCOPY OPERATIVE (N/A) EXPLORATORY LAPAROTOMY.  Removal of  Ectopic pregnancy.   (N/A) LYSIS OF ADHESION (N/A) UNILATERAL SALPINGECTOMY (Right)  Patient Location: PACU  Anesthesia Type:General  Level of Consciousness: awake, alert  and oriented  Airway & Oxygen Therapy: Patient Spontanous Breathing and Patient connected to nasal cannula oxygen  Post-op Assessment: Report given to PACU RN and Post -op Vital signs reviewed and stable  Post vital signs: Reviewed and stable  Complications: No apparent anesthesia complications

## 2012-10-11 NOTE — Progress Notes (Signed)
10/11/12 1500  Clinical Encounter Type  Visited With Patient  Visit Type (Bereavement/ectopic)  Referral From Nurse Delorse Lek, RN)  Spiritual Encounters  Spiritual Needs Grief support;Emotional  Stress Factors  Patient Stress Factors (Loss of fertility (not a situation that she got to choose))   Made initial contact with Amy Heath to offer emotional/spiritual support.  Though she was too tired to go in depth, she verbalized appreciation for visit and named that the loss of fertility is sad for her.  We talked about the difference between actively choosing not to have more children and no longer being able to have more children, and I named that loss as grief.  We may begin with that framework for support when I follow up tomorrow (after she has had time to rest).  In the meantime, Amy Heath is aware and appreciative of ongoing chaplain availability in case her needs change.  [address] [place], [place] [phone]

## 2012-10-11 NOTE — Anesthesia Preprocedure Evaluation (Signed)
Anesthesia Evaluation  Patient identified by MRN, date of birth, ID band Patient awake    Reviewed: Allergy & Precautions, H&P , NPO status , Patient's Chart, lab work & pertinent test results  Airway Mallampati: II TM Distance: >3 FB Neck ROM: full    Dental no notable dental hx. (+) Teeth Intact   Pulmonary neg pulmonary ROS,  breath sounds clear to auscultation  Pulmonary exam normal       Cardiovascular negative cardio ROS  Rhythm:regular     Neuro/Psych negative neurological ROS  negative psych ROS   GI/Hepatic negative GI ROS, Neg liver ROS,   Endo/Other  negative endocrine ROS  Renal/GU negative Renal ROS  negative genitourinary   Musculoskeletal   Abdominal Normal abdominal exam  (+)   Peds  Hematology  (+) Sickle cell trait ,   Anesthesia Other Findings Pierced upper lip  Reproductive/Obstetrics (+) Pregnancy                           Anesthesia Physical Anesthesia Plan  ASA: II and emergent  Anesthesia Plan: General ETT   Post-op Pain Management:    Induction:   Airway Management Planned:   Additional Equipment:   Intra-op Plan:   Post-operative Plan:   Informed Consent: I have reviewed the patients History and Physical, chart, labs and discussed the procedure including the risks, benefits and alternatives for the proposed anesthesia with the patient or authorized representative who has indicated his/her understanding and acceptance.   Dental Advisory Given  Plan Discussed with: Anesthesiologist, CRNA and Surgeon  Anesthesia Plan Comments:         Anesthesia Quick Evaluation

## 2012-10-11 NOTE — Anesthesia Postprocedure Evaluation (Signed)
  Anesthesia Post Note  Patient: Amy Heath  Procedure(s) Performed: Procedure(s) (LRB): LAPAROSCOPY OPERATIVE (N/A) EXPLORATORY LAPAROTOMY.  Removal of  Ectopic pregnancy.   (N/A) LYSIS OF ADHESION (N/A) UNILATERAL SALPINGECTOMY (Right)  Anesthesia type: GA  Patient location: PACU  Post pain: Pain level controlled  Post assessment: Post-op Vital signs reviewed  Last Vitals:  Filed Vitals:   10/11/12 0900  BP: 118/63  Pulse: 71  Temp:   Resp: 16    Post vital signs: Reviewed  Level of consciousness: sedated  Complications: No apparent anesthesia complications

## 2012-10-11 NOTE — H&P (Signed)
Amy Heath  ADMIT DATE:  10/11/2012   HPI  [name] is a 32 y.o. G7P6006 at [redacted]w[redacted]d who was brought in by EMS from home with complain of N/V, abdominal pain and vaginal bleeding. The patient states that the N/V/D began earlier today. She started having cramping later today and the bleeding started just prior to calling EMS. The patient has been seen by Dr. Shawnie Pons in [facility], [place] for this pregnancy.   OB History   Grav Para Term Preterm Abortions TAB SAB Ect Mult Living   7 6 6       6        Menstrual History: Patient's last menstrual period was 08/23/2012.    Past Medical History  Diagnosis Date  . Ovarian cyst   . Sickle cell trait     Past Surgical History  Procedure Laterality Date  . Ovarian cyst surgery      No family history on file.  Social History:  reports that she has never smoked. She does not have any smokeless tobacco history on file. She reports that she uses illicit drugs (Marijuana). She reports that she does not drink alcohol.  Allergies: No Known Allergies  Prescriptions prior to admission  Medication Sig Dispense Refill  . acetaminophen (TYLENOL) 325 MG tablet Take 650 mg by mouth every 6 (six) hours as needed.       Marland Kitchen ibuprofen (ADVIL,MOTRIN) 200 MG tablet Take 400 mg by mouth every 6 (six) hours as needed. Pain        ROS Review of Systems  Constitutional: Negative for fever and malaise/fatigue.  Gastrointestinal: Positive for nausea, vomiting, abdominal pain and diarrhea. Negative for constipation.  Genitourinary:  + vaginal bleeding   Physical Exam  Physical Exam   Blood pressure 125/72, pulse 73, temperature 97.6 F (36.4 C), temperature source Oral, resp. rate 16, last menstrual period 08/23/2012, SpO2 99.00%.  Physical Exam  Constitutional: She is oriented to person, place, and time. She appears well-developed and well-nourished. She appears ill.  HENT:  Head: Normocephalic and atraumatic.  Cardiovascular: Normal rate, regular  rhythm and normal heart sounds.  Respiratory: Effort normal and breath sounds normal. No respiratory distress.  GI: Soft. Bowel sounds are normal. She exhibits no distension and no mass. There is tenderness (moderate diffuse tenderness to palpation of abdomen). There is no rebound and no guarding.  Neurological: She is alert and oriented to person, place, and time.  Skin: Skin is warm and dry. No erythema.  Psychiatric: She has a normal mood and affect.   Results for orders placed during the hospital encounter of 10/11/12 (from the past 24 hour(s))  CBC     Status: Abnormal   Collection Time    10/11/12  3:49 AM      Result Value Range   WBC 11.5 (*) 4.0 - 10.5 K/uL   RBC 4.12  3.87 - 5.11 MIL/uL   Hemoglobin 11.1 (*) 12.0 - 15.0 g/dL   HCT 40.9 (*) 81.1 - 91.4 %   MCV 80.3  78.0 - 100.0 fL   MCH 26.9  26.0 - 34.0 pg   MCHC 33.5  30.0 - 36.0 g/dL   RDW 78.2  95.6 - 21.3 %   Platelets 354  150 - 400 K/uL  HCG, QUANTITATIVE, PREGNANCY     Status: Abnormal   Collection Time    10/11/12  3:49 AM      Result Value Range   hCG, Beta Chain, Quant, S 3993 (*) <5  mIU/mL  ABO/RH     Status: None   Collection Time    10/11/12  4:40 AM      Result Value Range   ABO/RH(D) AB POS      US Ob Comp Less 14 Wks  10/11/2012  *RADIOLOGY REPORT*  Clinical Data: Vaginal bleeding.  Positive pregnancy test.  Ectopic pregnancy.  OBSTETRIC <14 WK [place] AND TRANSVAGINAL OB US  Technique:  Both transabdominal and transvaginal ultrasound examinations were performed for complete evaluation of the gestation as well as the maternal uterus, adnexal regions, and pelvic cul-de-sac.  Transvaginal technique was performed to assess early pregnancy.  Comparison:  None.  Intrauterine gestational sac:  None visualized.  Maternal uterus/adnexae: There is a moderate amount of mildly complex free fluid which may represent hemoperitoneum.  This appears echogenic on several of the images and cinematic clips were obtained also  demonstrating echogenicity of the moderate amount of fluid posterior to the lower uterine segment.  The right ovary is difficult to define.  There is a right adnexal mass which is heterogeneous/complex with areas of increased and decreased echogenicity suspicious for hemorrhage.  Right adnexal mass measures 33 mm x 65 mm.  The left ovary is not identified.  Quantitative beta HCG is 3993.  IMPRESSION:  1.  Constellation of findings highly suspicious for right adnexal ectopic pregnancy.  Small to moderate amount of echogenic free fluid in the anatomic pelvis possibly representing hemoperitoneum associated with ruptured ectopic. 2. Critical Value/emergent results were called by telephone at the time of interpretation on 10/11/2012 at 0519 hours to  [facility], JULIE, N, who verbally acknowledged these results.   Original Report Authenticated By: Andreas Newport, M.D.    US Ob Transvaginal  10/11/2012  *RADIOLOGY REPORT*  Clinical Data: Vaginal bleeding.  Positive pregnancy test.  Ectopic pregnancy.  OBSTETRIC <14 WK [place] AND TRANSVAGINAL OB US  Technique:  Both transabdominal and transvaginal ultrasound examinations were performed for complete evaluation of the gestation as well as the maternal uterus, adnexal regions, and pelvic cul-de-sac.  Transvaginal technique was performed to assess early pregnancy.  Comparison:  None.  Intrauterine gestational sac:  None visualized.  Maternal uterus/adnexae: There is a moderate amount of mildly complex free fluid which may represent hemoperitoneum.  This appears echogenic on several of the images and cinematic clips were obtained also demonstrating echogenicity of the moderate amount of fluid posterior to the lower uterine segment.  The right ovary is difficult to define.  There is a right adnexal mass which is heterogeneous/complex with areas of increased and decreased echogenicity suspicious for hemorrhage.  Right adnexal mass measures 33 mm x 65 mm.  The left ovary is not  identified.  Quantitative beta HCG is 3993.  IMPRESSION:  1.  Constellation of findings highly suspicious for right adnexal ectopic pregnancy.  Small to moderate amount of echogenic free fluid in the anatomic pelvis possibly representing hemoperitoneum associated with ruptured ectopic. 2. Critical Value/emergent results were called by telephone at the time of interpretation on 10/11/2012 at 0519 hours to  [facility], JULIE, N, who verbally acknowledged these results.   Original Report Authenticated By: Andreas Newport, M.D.     Assessment/Plan: Right ectopic pregnancy with hemoperitoneum   P: - Admit - Exploratory laparoscopy with likely right salpingectomy and removal of ectopic pregnancy  Napoleon Form 10/11/2012, 9:12 AM

## 2012-10-11 NOTE — Op Note (Signed)
Amy Heath   PROCEDURE DATE: 10/11/2012   PREOPERATIVE DIAGNOSIS: Ruptured ectopic pregnancy   POSTOPERATIVE DIAGNOSIS: Ruptured right fallopian tube ectopic pregnancy  PROCEDURE: Exploratory laparoscopy converted to open laparotomy with right salpingectomy and removal of ectopic pregnancy and lysis of adhesions  SURGEON:  Elsie Lincoln, MD ASSISTANT: Napoleon Form, MD  INDICATIONS: 32 y.o. [id number] @ [redacted]w[redacted]d here for with ruptured ectopic pregnancy. On exam, she had stable vital signs, and abdomen tender. Hgb 11.1, blood type AB positive. Patient was counseled regarding need for laparoscopic salpingectomy. Risks of surgery including bleeding which may require transfusion or reoperation, infection, injury to bowel or other surrounding organs, need for additional procedures including laparotomy and other postoperative/anesthesia complications were explained to patient.  Written informed consent was obtained.  FINDINGS:  Moderate amount of hemoperitoneum estimated to be about 200 ml of blood and clots.  Dilated right fallopian tube with ectopic gestation. Small normal appearing uterus, absent left fallopian tube, normal right ovary with moderate adhesions and left ovary not identified due to adhesions involving the sigmoid colon and left adnexa.  Left ovary thought to be surgically absent.  ANESTHESIA: General INTRAVENOUS FLUIDS: 2000 ml ESTIMATED BLOOD LOSS: 200 ml URINE OUTPUT: 400 ml SPECIMENS: right fallopian tube, clots and other extruded material presumed to be products of conception COMPLICATIONS: None immediate  PROCEDURE IN DETAIL:  The patient was taken to the operating room where general anesthesia was administered and was found to be adequate.  She was placed in the dorsal lithotomy position, and was prepped and draped in a sterile manner.  A Foley catheter was inserted into her bladder and attached to constant drainage and a uterine manipulator was then advanced into the uterus .   After an adequate timeout was performed, attention was then turned to the patient's abdomen where a 10-mm skin incision was made on the umbilical fold.  The Veress needle was carefully introduced into the peritoneal cavity at a 45-degree angle into the abdominal wall. We were unable to confirm intraperitoneal placement, so umbilical incision was extended through fascia and the peritoneal cavity was entered via the open method.  [facility] trocar was placed into the peritoneal cavity.  Abdomen was insufflated with carbon dioxide gas.  Adequate pneumoperitoneum was obtained. A survey of the patient's pelvis and abdomen revealed the findings as above. Due to extensive adhesions of omentum and bowel, visibility was poor. Attempt was made to place a 5-mm port in the lower midline after injecting 5 ml of 0.5% Marcaine. However, it was difficult to maintain direct visualization, and decision was made to convert to open laparotomy.  The abdomen was desufflated and all instruments were then removed from the patient's abdomen. The fascial incision of the umbilicus was closed with a 0 Vicryl. All skin incisions were closed with 4-0 vicryl.  The uterine manipulator was removed from the vagina.   A Pfannenstiel skin incision was made with scalpel over previous scar and carried through to the underlying layer of fascia, incorporating the fascial incision from the recent port placement. The fascia was incised in the midline, and this incision was extended bilaterally using the Mayo scissors.  Kocher clamps were applied to the superior aspect of the fascial incision and the underlying rectus muscles were dissected off bluntly. A similar process was carried out on the inferior aspect of the fascial incision. The rectus muscles were split bluntly in the midline and the peritoneum entered sharply without complication. This peritoneal incision was then extended superiorly and inferiorly with  care given to prevent bowel or bladder  injury. Attention was then turned to the pelvis. A retractor was placed into the incision, and the bowel was packed away with moist laparotomy sponges. Hemoperitoneum was suctioned. The right tube and ovary were identified. The right fallopian tube appeared edematous with adjacent clots and tissue that appeared to be extruding from the tube. These were collected and sent for pathology. The right tube was grasped with Letrell Attwood clamps, cut, and the stumps ligated to achieve right salpingectomy.  Good hemostasis was noted.  The left tube was noted to be absent, and the left ovary was not identified but was thought to be surgically absent. The pelvis was irrigated and hemostasis obtained on all surfaces.  The fascia was then closed using 0 Vicryl in a running fashion.  The subcutaneous layer was irrigated, then reapproximated with 2-0 plain gut in running suture, and the skin was closed with a 4-0 Vicryl subcuticular stitch.  The patient tolerated the procedure well. Sponge, lap, instrument and needle counts were correct x 2.  She was taken to the recovery room in stable condition.   Napoleon Form, MD 10/11/2012   9:10 AM

## 2012-10-11 NOTE — MAU Provider Note (Signed)
History     CSN: [id number]  Arrival date and time: 10/11/12 7829   First Provider Initiated Contact with Patient 10/11/12 [phone]      No chief complaint on file.  HPI [name] is a 32 y.o. G7P6006 at [redacted]w[redacted]d who was brought in by EMS from home with complain of N/V, abdominal pain and vaginal bleeding. The patient states that the N/V/D began earlier today. She started having cramping later today and the bleeding started just prior to calling EMS. The patient has been seen by Dr. Shawnie Pons in [facility], [place] for this pregnancy.   OB History   Grav Para Term Preterm Abortions TAB SAB Ect Mult Living   7 6 6       6       Past Medical History  Diagnosis Date  . Ovarian cyst   . Sickle cell trait     Past Surgical History  Procedure Laterality Date  . Ovarian cyst surgery      No family history on file.  History  Substance Use Topics  . Smoking status: Never Smoker   . Smokeless tobacco: Not on file  . Alcohol Use: No    Allergies: No Known Allergies  Prescriptions prior to admission  Medication Sig Dispense Refill  . acetaminophen (TYLENOL) 325 MG tablet Take 650 mg by mouth every 6 (six) hours as needed.       Marland Kitchen ibuprofen (ADVIL,MOTRIN) 200 MG tablet Take 400 mg by mouth every 6 (six) hours as needed. Pain        Review of Systems  Constitutional: Negative for fever and malaise/fatigue.  Gastrointestinal: Positive for nausea, vomiting, abdominal pain and diarrhea. Negative for constipation.  Genitourinary:       + vaginal bleeding   Physical Exam   Blood pressure 125/72, pulse 73, temperature 97.6 F (36.4 C), temperature source Oral, resp. rate 16, last menstrual period 08/23/2012, SpO2 99.00%.  Physical Exam  Constitutional: She is oriented to person, place, and time. She appears well-developed and well-nourished. She appears ill.  HENT:  Head: Normocephalic and atraumatic.  Cardiovascular: Normal rate, regular rhythm and normal heart sounds.    Respiratory: Effort normal and breath sounds normal. No respiratory distress.  GI: Soft. Bowel sounds are normal. She exhibits no distension and no mass. There is tenderness (moderate diffuse tenderness to palpation of abdomen). There is no rebound and no guarding.  Neurological: She is alert and oriented to person, place, and time.  Skin: Skin is warm and dry. No erythema.  Psychiatric: She has a normal mood and affect.   Results for orders placed during the hospital encounter of 10/11/12 (from the past 24 hour(s))  CBC     Status: Abnormal   Collection Time    10/11/12  3:49 AM      Result Value Range   WBC 11.5 (*) 4.0 - 10.5 K/uL   RBC 4.12  3.87 - 5.11 MIL/uL   Hemoglobin 11.1 (*) 12.0 - 15.0 g/dL   HCT 30.8 (*) 65.7 - 84.6 %   MCV 80.3  78.0 - 100.0 fL   MCH 26.9  26.0 - 34.0 pg   MCHC 33.5  30.0 - 36.0 g/dL   RDW 96.2  95.2 - 84.1 %   Platelets 354  150 - 400 K/uL  HCG, QUANTITATIVE, PREGNANCY     Status: Abnormal   Collection Time    10/11/12  3:49 AM      Result Value Range   hCG, Beta  [name], S 3993 (*) <5 mIU/mL  ABO/RH     Status: None   Collection Time    10/11/12  4:40 AM      Result Value Range   ABO/RH(D) AB POS     *RADIOLOGY REPORT*  Clinical Data: Vaginal bleeding. Positive pregnancy test. Ectopic  pregnancy.  OBSTETRIC <14 WK [place] AND TRANSVAGINAL OB US  Technique: Both transabdominal and transvaginal ultrasound  examinations were performed for complete evaluation of the  gestation as well as the maternal uterus, adnexal regions, and  pelvic cul-de-sac. Transvaginal technique was performed to assess  early pregnancy.  Comparison: None.  Intrauterine gestational sac: None visualized.  Maternal uterus/adnexae:  There is a moderate amount of mildly complex free fluid which may  represent hemoperitoneum. This appears echogenic on several of the  images and cinematic clips were obtained also demonstrating  echogenicity of the moderate amount of fluid  posterior to the lower  uterine segment.  The right ovary is difficult to define. There is a right adnexal  mass which is heterogeneous/complex with areas of increased and  decreased echogenicity suspicious for hemorrhage. Right adnexal  mass measures 33 mm x 65 mm. The left ovary is not identified.  Quantitative beta HCG is 3993.  IMPRESSION:  1. Constellation of findings highly suspicious for right adnexal  ectopic pregnancy. Small to moderate amount of echogenic free  fluid in the anatomic pelvis possibly representing hemoperitoneum  associated with ruptured ectopic.  2. Critical Value/emergent results were called by telephone at the  time of interpretation on 10/11/2012 at 0519 hours to [facility],  JULIE, N, who verbally acknowledged these results.  Original Report Authenticated By: Andreas Newport, M.D.  MAU Course  Procedures None  MDM Patient actively vomiting upon arrival. Bleeding appears heavy on arrival as well. BP normal and stable.  IV started. IV Zofran and Dilaudid given.  CBC, ABO/Rh, Quant hCG and US obtained  Assessment and Plan  A: Right ectopic pregnancy with hemoperitoneum   P: Report called to Dr. Penne Lash.  0530 - Dr. Jearld Lesch in MAU to evaluate patient Patient to OR  [name], PA-C  10/11/2012, 5:46 AM   Pt seen and examined.  Pt is having right sided abdominal/pelvic pain.  [place] is worrisome for ectopic pregnancy.  Questionable complex fluid in pelvis.  Cervix is closed and firm with some vaginal bleeding.  Pt seen and examined.  Pt consented for Laparoscopy, right salpingectomy, and removal of ectopic pregnancy. Risks include but not limited to bleeding, infection, damage to intrabdominal organs, complications from anesthesia.  [name] 6:04 AM

## 2012-10-11 NOTE — Progress Notes (Signed)
UR completed 

## 2012-10-11 NOTE — MAU Note (Signed)
Diarrhea earlier today, pain with vomiting today has seen Dr. Shawnie Pons in [facility].

## 2012-10-11 NOTE — Op Note (Signed)
See other operative noe

## 2012-10-12 ENCOUNTER — Encounter (HOSPITAL_COMMUNITY): Payer: Self-pay | Admitting: Obstetrics & Gynecology

## 2012-10-12 LAB — CBC
Hemoglobin: 8.8 g/dL — ABNORMAL LOW (ref 12.0–15.0)
MCHC: 33.5 g/dL (ref 30.0–36.0)
MCV: 80.4 fL (ref 78.0–100.0)
Platelets: 290 10*3/uL (ref 150–400)
Platelets: 303 10*3/uL (ref 150–400)
RBC: 3.27 MIL/uL — ABNORMAL LOW (ref 3.87–5.11)
RDW: 15.2 % (ref 11.5–15.5)
WBC: 14.9 10*3/uL — ABNORMAL HIGH (ref 4.0–10.5)

## 2012-10-12 MED ORDER — DIPHENHYDRAMINE HCL 25 MG PO CAPS
25.0000 mg | ORAL_CAPSULE | Freq: Four times a day (QID) | ORAL | Status: DC | PRN
Start: 2012-10-12 — End: 2012-10-13
  Administered 2012-10-12 (×2): 25 mg via ORAL
  Filled 2012-10-12 (×2): qty 1

## 2012-10-12 MED ORDER — HYDROMORPHONE 0.3 MG/ML IV SOLN
INTRAVENOUS | Status: DC
  Administered 2012-10-12: 05:00:00 via INTRAVENOUS
  Administered 2012-10-12: 1.9 mg via INTRAVENOUS
  Filled 2012-10-12: qty 25

## 2012-10-12 NOTE — Progress Notes (Addendum)
1 Day Post-Op Procedure(s) (LRB): LAPAROSCOPY OPERATIVE (N/A) EXPLORATORY LAPAROTOMY.  Removal of  Ectopic pregnancy.   (N/A) LYSIS OF ADHESION (N/A) UNILATERAL SALPINGECTOMY (Right)  Subjective: Patient reports tolerating PO.    Objective: I have reviewed patient's vital signs, intake and output, medications and labs.  Filed Vitals:   10/12/12 0608  BP: 101/57  Pulse: 79  Temp: 98.4 F (36.9 C)  Resp: 16    General: alert, cooperative and no distress Resp: clear to auscultation bilaterally Cardio: regular rate and rhythm GI: soft, non-tender; bowel sounds normal; no masses,  no organomegaly Extremities: Homans sign is negative, no sign of DVT  CBC    Component Value Date/Time   WBC 17.8* 10/12/2012 0540   RBC 3.27* 10/12/2012 0540   HGB 8.8* 10/12/2012 0540   HCT 26.3* 10/12/2012 0540   PLT 290 10/12/2012 0540   MCV 80.4 10/12/2012 0540   MCH 26.9 10/12/2012 0540   MCHC 33.5 10/12/2012 0540   RDW 15.2 10/12/2012 0540     Assessment: s/p Procedure(s): LAPAROSCOPY OPERATIVE (N/A) EXPLORATORY LAPAROTOMY.  Removal of  Ectopic pregnancy.   (N/A) LYSIS OF ADHESION (N/A) UNILATERAL SALPINGECTOMY (Right): stable and progressing well  Plan: Advance diet Encourage ambulation Advance to PO medication Discontinue IV fluids  LOS: 1 day  Rpt cbc this morning.  hgb rpior to surgery 11.1 and decreased to 8.8. Benadryl for itching.  Yamel Bale H. 10/12/2012, 9:15 AM

## 2012-10-12 NOTE — H&P (Signed)
Pt seen and examined.  See note from MAU for surgical consent.  Amy Askren H.

## 2012-10-12 NOTE — Progress Notes (Signed)
10/12/12 1400  Clinical Encounter Type  Visited With Patient  Visit Type Follow-up;Spiritual support;Social support  Spiritual Encounters  Spiritual Needs Emotional   Amy Heath describes herself as "doing better than [she] expected" overall, after initially feeling sad about loss of tube.  She is looking forward to relaxing at home and spending time with her children after discharge tomorrow.  She also welcomed me to visit again tomorrow; we agreed that I'd come say goodbye if she is still here when I come in.  I reminded her that we chaplains are available for follow-up support after discharge and encouraged her to call if she'd ever find a check-in helpful.  [address] [place], [place] [phone]

## 2012-10-13 MED ORDER — OXYCODONE-ACETAMINOPHEN 5-325 MG PO TABS: 1.0000 | ORAL_TABLET | ORAL | Status: DC | PRN

## 2012-10-13 MED ORDER — IBUPROFEN 800 MG PO TABS: 800.0000 mg | ORAL_TABLET | Freq: Three times a day (TID) | ORAL | Status: DC | PRN

## 2012-10-13 MED ORDER — ZOLPIDEM TARTRATE 5 MG PO TABS: 5.0000 mg | ORAL_TABLET | Freq: Every evening | ORAL | Status: DC | PRN

## 2012-10-13 NOTE — Discharge Summary (Signed)
Physician Discharge Summary  Patient ID: Amy Heath MRN: [id number] DOB/AGE: [birth year] 32 y.o.  Admit date: 10/11/2012 Discharge date: 10/13/2012  Admission Diagnoses: Ruptured ectopic pregnancy  Discharge Diagnoses: s/p laparotomy with right salpingectomy Active Problems:   * No active hospital problems. *   Discharged Condition: good  Hospital Course: Patient presented to MAU and diagnosed with a ruptured ectopic pregnancy. Patient was taken to the OR for planned laparoscopic right salpingectomy but was converted to a laparotomy secondary to poor visualization due to adhesions. Evacuation of hemoperitoneum and right salpingectomy was performed. The post operative course was uncomplicated. Patient was found to be stable for discharge on POD#2 with a stable hemoglobin of 8.8  Consults: None  Significant Diagnostic Studies: radiology: Ultrasound:   IMPRESSION:  1. Constellation of findings highly suspicious for right adnexal  ectopic pregnancy. Small to moderate amount of echogenic free  fluid in the anatomic pelvis possibly representing hemoperitoneum  associated with ruptured ectopic.   Treatments: surgery: right salpingectomy via laparotomy  Discharge Exam: Blood pressure 105/61, pulse 89, temperature 98.2 F (36.8 C), temperature source Oral, resp. rate 18, height 4\' 6"  (1.372 m), weight 183 lb (83.008 kg), last menstrual period 08/23/2012, SpO2 100.00%, unknown if currently breastfeeding. General appearance: alert, cooperative and no distress Resp: clear to auscultation bilaterally Cardio: regular rate and rhythm Extremities: Homans sign is negative, no sign of DVT and no edema, redness or tenderness in the calves or thighs Incision/Wound: no erythema, induration or drainage  Disposition: 01-Home or Self Care     Medication List    STOP taking these medications       acetaminophen 325 MG tablet  Commonly known as:  TYLENOL      TAKE these medications       ibuprofen 800 MG tablet  Commonly known as:  ADVIL,MOTRIN  Take 1 tablet (800 mg total) by mouth every 8 (eight) hours as needed (mild pain).     oxyCODONE-acetaminophen 5-325 MG per tablet  Commonly known as:  PERCOCET/ROXICET  Take 1-2 tablets by mouth every 4 (four) hours as needed.     zolpidem 5 MG tablet  Commonly known as:  AMBIEN  Take 1 tablet (5 mg total) by mouth at bedtime as needed for sleep.           Follow-up Information   Follow up with [facility]. (An appointment will be made for you to follow up in 3-4 weeks)    Contact information:   [address] [place] [address] [phone]      Signed: Devonna Oboyle 10/13/2012, 9:53 AM

## 2012-10-25 ENCOUNTER — Telehealth: Payer: Self-pay | Admitting: Obstetrics and Gynecology

## 2012-10-26 MED ORDER — OXYCODONE-ACETAMINOPHEN 5-325 MG PO TABS: 1.0000 | ORAL_TABLET | ORAL | Status: DC | PRN

## 2012-10-26 NOTE — Telephone Encounter (Signed)
Patient called and informed of Rx ready for pickup. Patient verbalized understanding

## 2012-10-26 NOTE — Telephone Encounter (Signed)
Attempted to contact patient to inform her of refill on her percocet. Medication ordered and refill at front desk. Tried to contact patient to inform her, but some woman answered and stated she wasn't there and she will have to call us back.

## 2012-11-08 ENCOUNTER — Encounter (HOSPITAL_COMMUNITY): Payer: Self-pay | Admitting: *Deleted

## 2012-11-08 ENCOUNTER — Inpatient Hospital Stay (HOSPITAL_COMMUNITY)
Admission: AD | Admit: 2012-11-08 | Discharge: 2012-11-08 | Disposition: A | Discharge status: Home / Self Care | Payer: Medicaid Other | Source: Ambulatory Visit | Attending: Obstetrics & Gynecology | Admitting: Obstetrics & Gynecology

## 2012-11-08 DIAGNOSIS — T8140XA Infection following a procedure, unspecified, initial encounter: Secondary | ICD-10-CM | POA: Insufficient documentation

## 2012-11-08 DIAGNOSIS — L03319 Cellulitis of trunk, unspecified: Secondary | ICD-10-CM | POA: Insufficient documentation

## 2012-11-08 DIAGNOSIS — Y838 Other surgical procedures as the cause of abnormal reaction of the patient, or of later complication, without mention of misadventure at the time of the procedure: Secondary | ICD-10-CM | POA: Insufficient documentation

## 2012-11-08 DIAGNOSIS — L02219 Cutaneous abscess of trunk, unspecified: Secondary | ICD-10-CM | POA: Insufficient documentation

## 2012-11-08 MED ORDER — SULFAMETHOXAZOLE-TRIMETHOPRIM 800-160 MG PO TABS: 1.0000 | ORAL_TABLET | Freq: Two times a day (BID) | ORAL | Status: AC

## 2012-11-08 NOTE — Progress Notes (Signed)
Raynelle Fanning Either PA in to see pt. Discussed plan of care and d/c plan. Written and verbal d/c instructions given and understanding voiced.

## 2012-11-08 NOTE — MAU Provider Note (Signed)
Attestation of Attending Supervision of Advanced Practitioner (PA/CNM/NP): Evaluation and management procedures were performed by the Advanced Practitioner under my supervision and collaboration.  I have reviewed the Advanced Practitioner's note and chart, and I agree with the management and plan.  Denny Lave, MD, FACOG Attending Obstetrician & Gynecologist Faculty Practice, [facility]  

## 2012-11-08 NOTE — MAU Provider Note (Signed)
History     CSN: [id number]  Arrival date and time: 11/08/12 1514   First Provider Initiated Contact with Patient 11/08/12 1638      Chief Complaint  Patient presents with  . Wound Check  . Nausea   HPI [name] is a 32 y.o. [phone] who presents to MAU with swelling and drainage at her surgical wound site. The patient had surgery for a ruptured ectopic on 10/12/12. She states that the scar was healing well and yesterday became a large "ball of puss" that drained this morning. She has had some nausea without vomiting. She denies fever, vaginal bleeding, or discharge. This is only at the small laparoscopic site just below the navel. Her other scar is well healed and not draining.    OB History   Grav Para Term Preterm Abortions TAB SAB Ect Mult Living   7 6 6  1   1  6       Past Medical History  Diagnosis Date  . Ovarian cyst   . Sickle cell trait     Past Surgical History  Procedure Laterality Date  . Ovarian cyst surgery    . Laparoscopy N/A 10/11/2012    Procedure: LAPAROSCOPY OPERATIVE;  Surgeon: Lesly Dukes, MD;  Location: [facility];  Service: Gynecology;  Laterality: N/A;  . Laparotomy N/A 10/11/2012    Procedure: EXPLORATORY LAPAROTOMY.  Removal of  Ectopic pregnancy.  ;  Surgeon: Lesly Dukes, MD;  Location: [facility];  Service: Gynecology;  Laterality: N/A;  . Lysis of adhesion N/A 10/11/2012    Procedure: LYSIS OF ADHESION;  Surgeon: Lesly Dukes, MD;  Location: [facility];  Service: Gynecology;  Laterality: N/A;  . Unilateral salpingectomy Right 10/11/2012    Procedure: UNILATERAL SALPINGECTOMY;  Surgeon: Lesly Dukes, MD;  Location: [facility];  Service: Gynecology;  Laterality: Right;    Family History  Problem Relation Age of Onset  . Alcohol abuse Neg Hx     History  Substance Use Topics  . Smoking status: Never Smoker   . Smokeless tobacco: Not on file  . Alcohol Use: No    Allergies: No Known Allergies  Prescriptions prior to admission   Medication Sig Dispense Refill  . ibuprofen (ADVIL,MOTRIN) 800 MG tablet Take 1 tablet (800 mg total) by mouth every 8 (eight) hours as needed (mild pain).  30 tablet  1  . oxyCODONE-acetaminophen (PERCOCET/ROXICET) 5-325 MG per tablet Take 1-2 tablets by mouth every 4 (four) hours as needed.  10 tablet  0  . zolpidem (AMBIEN) 5 MG tablet Take 1 tablet (5 mg total) by mouth at bedtime as needed for sleep.  15 tablet  0    Review of Systems  Constitutional: Positive for chills. Negative for fever and malaise/fatigue.  Gastrointestinal: Positive for nausea and abdominal pain. Negative for vomiting.  Genitourinary: Negative for dysuria, urgency and frequency.       Neg - vaginal bleeding, discharge   Physical Exam   Blood pressure 120/76, pulse 91, temperature 98.7 F (37.1 C), temperature source Oral, resp. rate 16, height 5\' 2"  (1.575 m), weight 191 lb 12.8 oz (87 kg), last menstrual period 08/23/2012, SpO2 100.00%.  Physical Exam  Constitutional: She is oriented to person, place, and time. She appears well-developed and well-nourished. No distress.  HENT:  Head: Normocephalic and atraumatic.  Cardiovascular: Normal rate, regular rhythm and normal heart sounds.   Respiratory: Effort normal and breath sounds normal. No respiratory distress.  GI: Soft. She exhibits  no distension and no mass. There is tenderness. There is no rebound and no guarding.  2 cm well-healed scar from laparoscopy just below the navel with surrounding area of erythema. Moderate tenderness to palpation and warm to touch  Neurological: She is alert and oriented to person, place, and time.  Skin: Skin is warm and dry. No erythema.  Psychiatric: She has a normal mood and affect.    MAU Course  Procedures None  MDM Discussed with Dr. Macon Large. Rx for Bactrim DS BID x 7 days. Follow-up in clinic as needed. No need for appointment tomorrow  Assessment and Plan  A: Infection of surgical wound with surrounding  cellulitis  P: Discharge home Rx for Bactrim sent to patient's pharmacy Patient may follow-up in clinic as needed if symptoms persist. Appointment for tomorrow's follow-up cancelled.  Patient may return to MAU as needed if her symptoms were to change or worsen  Amy Starr, PA-C  11/08/2012, 4:38 PM

## 2012-11-08 NOTE — MAU Note (Signed)
Patient states she had a laparoscopy for a ruptured ectopic on 3-25. States the umbilical incision became swollen yesterday then started to hurt and drain. States she is very nauseated, no vomiting.

## 2012-11-08 NOTE — MAU Note (Signed)
Umbilical incision currently dry but pt states occ leaks small amt fld. Area below umbilicus slightly firm and reddened. Lower abd incision from laparotomy clean and dry and well healed.

## 2012-11-09 ENCOUNTER — Ambulatory Visit: Payer: Medicaid Other | Admitting: Obstetrics & Gynecology

## 2012-11-28 ENCOUNTER — Encounter: Payer: Self-pay | Admitting: *Deleted

## 2013-03-05 ENCOUNTER — Encounter (HOSPITAL_COMMUNITY): Payer: Self-pay | Admitting: Emergency Medicine

## 2013-03-05 DIAGNOSIS — R197 Diarrhea, unspecified: Secondary | ICD-10-CM | POA: Insufficient documentation

## 2013-03-05 DIAGNOSIS — Z8742 Personal history of other diseases of the female genital tract: Secondary | ICD-10-CM | POA: Insufficient documentation

## 2013-03-05 DIAGNOSIS — R51 Headache: Secondary | ICD-10-CM | POA: Insufficient documentation

## 2013-03-05 DIAGNOSIS — L299 Pruritus, unspecified: Secondary | ICD-10-CM | POA: Insufficient documentation

## 2013-03-05 DIAGNOSIS — B372 Candidiasis of skin and nail: Secondary | ICD-10-CM | POA: Insufficient documentation

## 2013-03-05 DIAGNOSIS — R1110 Vomiting, unspecified: Secondary | ICD-10-CM | POA: Insufficient documentation

## 2013-03-05 DIAGNOSIS — R599 Enlarged lymph nodes, unspecified: Secondary | ICD-10-CM | POA: Insufficient documentation

## 2013-03-05 DIAGNOSIS — R21 Rash and other nonspecific skin eruption: Secondary | ICD-10-CM | POA: Insufficient documentation

## 2013-03-05 DIAGNOSIS — Z862 Personal history of diseases of the blood and blood-forming organs and certain disorders involving the immune mechanism: Secondary | ICD-10-CM | POA: Insufficient documentation

## 2013-03-05 LAB — CBC WITH DIFFERENTIAL/PLATELET
Hemoglobin: 11.2 g/dL — ABNORMAL LOW (ref 12.0–15.0)
Lymphs Abs: 3 10*3/uL (ref 0.7–4.0)
Monocytes Relative: 13 % — ABNORMAL HIGH (ref 3–12)
Neutro Abs: 3.4 10*3/uL (ref 1.7–7.7)
Neutrophils Relative %: 45 % (ref 43–77)
Platelets: 358 10*3/uL (ref 150–400)
RBC: 4.4 MIL/uL (ref 3.87–5.11)
WBC: 7.6 10*3/uL (ref 4.0–10.5)

## 2013-03-05 LAB — URINALYSIS, ROUTINE W REFLEX MICROSCOPIC
Nitrite: NEGATIVE
Specific Gravity, Urine: 1.015 (ref 1.005–1.030)
pH: 8.5 — ABNORMAL HIGH (ref 5.0–8.0)

## 2013-03-05 LAB — URINE MICROSCOPIC-ADD ON

## 2013-03-05 LAB — COMPREHENSIVE METABOLIC PANEL
ALT: 12 U/L (ref 0–35)
Albumin: 3.7 g/dL (ref 3.5–5.2)
Alkaline Phosphatase: 97 U/L (ref 39–117)
BUN: 7 mg/dL (ref 6–23)
Chloride: 101 mEq/L (ref 96–112)
Glucose, Bld: 132 mg/dL — ABNORMAL HIGH (ref 70–99)
Potassium: 3.5 mEq/L (ref 3.5–5.1)
Sodium: 137 mEq/L (ref 135–145)
Total Bilirubin: 0.2 mg/dL — ABNORMAL LOW (ref 0.3–1.2)

## 2013-03-05 LAB — POCT PREGNANCY, URINE: Preg Test, Ur: NEGATIVE

## 2013-03-05 NOTE — ED Notes (Signed)
PT. REPORTS EMESIS / DIARRHEA X2 DAYS AND VAGINAL RASH .

## 2013-03-06 ENCOUNTER — Emergency Department (HOSPITAL_COMMUNITY)
Admission: EM | Admit: 2013-03-06 | Discharge: 2013-03-06 | Disposition: A | Discharge status: Home / Self Care | Payer: Self-pay | Attending: Emergency Medicine | Admitting: Emergency Medicine

## 2013-03-06 ENCOUNTER — Emergency Department (HOSPITAL_COMMUNITY): Payer: Self-pay

## 2013-03-06 DIAGNOSIS — B372 Candidiasis of skin and nail: Secondary | ICD-10-CM

## 2013-03-06 DIAGNOSIS — L299 Pruritus, unspecified: Secondary | ICD-10-CM

## 2013-03-06 MED ORDER — CLOTRIMAZOLE 1 % EX CREA
TOPICAL_CREAM | Freq: Two times a day (BID) | CUTANEOUS | Status: DC
  Administered 2013-03-06: 1 via TOPICAL
  Filled 2013-03-06: qty 15

## 2013-03-06 MED ORDER — DIPHENHYDRAMINE HCL 50 MG/ML IJ SOLN
50.0000 mg | Freq: Once | INTRAMUSCULAR | Status: AC
  Administered 2013-03-06: 50 mg via INTRAVENOUS
  Filled 2013-03-06: qty 1

## 2013-03-06 MED ORDER — KETOROLAC TROMETHAMINE 30 MG/ML IJ SOLN
30.0000 mg | Freq: Once | INTRAMUSCULAR | Status: AC
  Administered 2013-03-06: 30 mg via INTRAVENOUS
  Filled 2013-03-06: qty 1

## 2013-03-06 MED ORDER — METOCLOPRAMIDE HCL 5 MG/ML IJ SOLN
10.0000 mg | Freq: Once | INTRAMUSCULAR | Status: AC
  Administered 2013-03-06: 10 mg via INTRAVENOUS
  Filled 2013-03-06: qty 2

## 2013-03-06 MED ORDER — HYDROXYZINE HCL 25 MG PO TABS: 25.0000 mg | ORAL_TABLET | Freq: Four times a day (QID) | ORAL | Status: DC | PRN

## 2013-03-06 MED ORDER — SODIUM CHLORIDE 0.9 % IV BOLUS (SEPSIS)
1000.0000 mL | Freq: Once | INTRAVENOUS | Status: AC
  Administered 2013-03-06: 1000 mL via INTRAVENOUS

## 2013-03-06 NOTE — ED Provider Notes (Signed)
CSN: [id number]     Arrival date & time 03/05/13  2224 History     First MD Initiated Contact with Patient 03/06/13 0202     Chief Complaint  Patient presents with  . Headache   (Consider location/radiation/quality/duration/timing/severity/associated sxs/prior Treatment) HPI This is a 32 year old female with a one-month history of a generalized headache that comes and goes. Headache is moderate in intensity. She had also had generalized itching for over a year. She is here this morning because she is developed a pruritic rash in her groin folds, suprapubic region and submammary folds. She states her itching is severe and has not been relieved by Benadryl. Also she has had 2 episodes of vomiting and diarrhea since yesterday morning. She denies abdominal pain. She has had a scratchy throat and anterior cervical lymphadenopathy for the past 2 days as well.  Past Medical History  Diagnosis Date  . Ovarian cyst   . Sickle cell trait    Past Surgical History  Procedure Laterality Date  . Ovarian cyst surgery    . Laparoscopy N/A 10/11/2012    Procedure: LAPAROSCOPY OPERATIVE;  Surgeon: Lesly Dukes, MD;  Location: [facility];  Service: Gynecology;  Laterality: N/A;  . Laparotomy N/A 10/11/2012    Procedure: EXPLORATORY LAPAROTOMY.  Removal of  Ectopic pregnancy.  ;  Surgeon: Lesly Dukes, MD;  Location: [facility];  Service: Gynecology;  Laterality: N/A;  . Lysis of adhesion N/A 10/11/2012    Procedure: LYSIS OF ADHESION;  Surgeon: Lesly Dukes, MD;  Location: [facility];  Service: Gynecology;  Laterality: N/A;  . Unilateral salpingectomy Right 10/11/2012    Procedure: UNILATERAL SALPINGECTOMY;  Surgeon: Lesly Dukes, MD;  Location: [facility];  Service: Gynecology;  Laterality: Right;   Family History  Problem Relation Age of Onset  . Alcohol abuse Neg Hx    History  Substance Use Topics  . Smoking status: Never Smoker   . Smokeless tobacco: Not on file  . Alcohol Use: No   OB History   Grav Para Term Preterm Abortions TAB SAB Ect Mult Living   7 6 6  1   1  6      Review of Systems  All other systems reviewed and are negative.    Allergies  Review of patient's allergies indicates no known allergies.  Home Medications   Current Outpatient Rx  Name  Route  Sig  Dispense  Refill  . acetaminophen (TYLENOL) 500 MG tablet   Oral   Take 1,000 mg by mouth every 6 (six) hours as needed for pain.         . diphenhydrAMINE (BENADRYL) 25 MG tablet   Oral   Take 25 mg by mouth every 6 (six) hours as needed for itching.          BP 116/72  Pulse 92  Temp(Src) 98.4 F (36.9 C) (Oral)  Resp 14  SpO2 97%  LMP 02/19/2013  Physical Exam General: Well-developed, well-nourished female in no acute distress; appearance consistent with age of record HENT: normocephalic, atraumatic; no pharyngeal erythema or exudate Eyes: pupils equal round and reactive to light; extraocular muscles intact Neck: supple; anterior cervical lymphadenopathy Heart: regular rate and rhythm Lungs: clear to auscultation bilaterally Abdomen: soft; nondistended; nontender; no masses or hepatosplenomegaly; bowel sounds present Extremities: No deformity; full range of motion Neurologic: Awake, alert and oriented; motor function intact in all extremities and symmetric; no facial droop Skin: Warm and dry; intertriginous rash of the groin folds and  inframammary folds Psychiatric: Normal mood and affect    ED Course   Procedures (including critical care time)    MDM   Nursing notes and vitals signs, including pulse oximetry, reviewed.  Summary of this visit's results, reviewed by myself:  Labs:  Results for orders placed during the hospital encounter of 03/06/13 (from the past 24 hour(s))  POCT PREGNANCY, URINE     Status: None   Collection Time    03/05/13 10:59 PM      Result Value Range   Preg Test, Ur NEGATIVE  NEGATIVE  URINALYSIS, ROUTINE W REFLEX MICROSCOPIC     Status: Abnormal    Collection Time    03/05/13 11:06 PM      Result Value Range   Color, Urine YELLOW  YELLOW   APPearance CLEAR  CLEAR   Specific Gravity, Urine 1.015  1.005 - 1.030   pH 8.5 (*) 5.0 - 8.0   Glucose, UA NEGATIVE  NEGATIVE mg/dL   Hgb urine dipstick NEGATIVE  NEGATIVE   Bilirubin Urine NEGATIVE  NEGATIVE   Ketones, ur NEGATIVE  NEGATIVE mg/dL   Protein, ur NEGATIVE  NEGATIVE mg/dL   Urobilinogen, UA 1.0  0.0 - 1.0 mg/dL   Nitrite NEGATIVE  NEGATIVE   Leukocytes, UA TRACE (*) NEGATIVE  CBC WITH DIFFERENTIAL     Status: Abnormal   Collection Time    03/05/13 11:06 PM      Result Value Range   WBC 7.6  4.0 - 10.5 K/uL   RBC 4.40  3.87 - 5.11 MIL/uL   Hemoglobin 11.2 (*) 12.0 - 15.0 g/dL   HCT 16.1 (*) 09.6 - 04.5 %   MCV 77.5 (*) 78.0 - 100.0 fL   MCH 25.5 (*) 26.0 - 34.0 pg   MCHC 32.8  30.0 - 36.0 g/dL   RDW 40.9 (*) 81.1 - 91.4 %   Platelets 358  150 - 400 K/uL   Neutrophils Relative % 45  43 - 77 %   Neutro Abs 3.4  1.7 - 7.7 K/uL   Lymphocytes Relative 40  12 - 46 %   Lymphs Abs 3.0  0.7 - 4.0 K/uL   Monocytes Relative 13 (*) 3 - 12 %   Monocytes Absolute 1.0  0.1 - 1.0 K/uL   Eosinophils Relative 3  0 - 5 %   Eosinophils Absolute 0.2  0.0 - 0.7 K/uL   Basophils Relative 0  0 - 1 %   Basophils Absolute 0.0  0.0 - 0.1 K/uL  COMPREHENSIVE METABOLIC PANEL     Status: Abnormal   Collection Time    03/05/13 11:06 PM      Result Value Range   Sodium 137  135 - 145 mEq/L   Potassium 3.5  3.5 - 5.1 mEq/L   Chloride 101  96 - 112 mEq/L   CO2 26  19 - 32 mEq/L   Glucose, Bld 132 (*) 70 - 99 mg/dL   BUN 7  6 - 23 mg/dL   Creatinine, Ser 7.82  0.50 - 1.10 mg/dL   Calcium 9.3  8.4 - 95.6 mg/dL   Total Protein 7.6  6.0 - 8.3 g/dL   Albumin 3.7  3.5 - 5.2 g/dL   AST 15  0 - 37 U/L   ALT 12  0 - 35 U/L   Alkaline Phosphatase 97  39 - 117 U/L   Total Bilirubin 0.2 (*) 0.3 - 1.2 mg/dL   GFR calc non Af Amer >90  >90  mL/min   GFR calc Af Amer >90  >90 mL/min  URINE  MICROSCOPIC-ADD ON     Status: Abnormal   Collection Time    03/05/13 11:06 PM      Result Value Range   Squamous Epithelial / LPF MANY (*) RARE   WBC, UA 3-6  <3 WBC/hpf   RBC / HPF 0-2  <3 RBC/hpf   Bacteria, UA MANY (*) RARE    Imaging Studies: Ct Head Wo Contrast  03/06/2013   *RADIOLOGY REPORT*  Clinical Data: Headache for 1 month  CT HEAD WITHOUT CONTRAST  Technique:  Contiguous axial images were obtained from the base of the skull through the vertex without contrast.  Comparison: None.  Findings: There is no acute intracranial hemorrhage or infarct.  No mass or midline shift.  CSF containing spaces are normal.  No extra- axial fluid collection.  Calvarium is intact.  Paranasal sinuses and mastoid air cells are clear.  IMPRESSION: Normal head CT with no acute intracranial process.   Original Report Authenticated By: Rise Mu, M.D.   4:58 AM Headache and itching improved after IV medications. We will treat her for candidal intertrigo.  Hanley Seamen, MD 03/06/13 [phone]

## 2013-03-06 NOTE — ED Notes (Signed)
Pt c/o itching all over.

## 2013-03-07 LAB — URINE CULTURE: Colony Count: 70000

## 2013-05-17 ENCOUNTER — Encounter (HOSPITAL_COMMUNITY): Payer: Self-pay | Admitting: Emergency Medicine

## 2013-05-17 ENCOUNTER — Emergency Department (HOSPITAL_COMMUNITY)
Admission: EM | Admit: 2013-05-17 | Discharge: 2013-05-17 | Disposition: A | Discharge status: Home / Self Care | Payer: Medicaid Other | Attending: Emergency Medicine | Admitting: Emergency Medicine

## 2013-05-17 DIAGNOSIS — M549 Dorsalgia, unspecified: Secondary | ICD-10-CM | POA: Insufficient documentation

## 2013-05-17 DIAGNOSIS — Z8742 Personal history of other diseases of the female genital tract: Secondary | ICD-10-CM | POA: Insufficient documentation

## 2013-05-17 DIAGNOSIS — M25511 Pain in right shoulder: Secondary | ICD-10-CM

## 2013-05-17 DIAGNOSIS — Z862 Personal history of diseases of the blood and blood-forming organs and certain disorders involving the immune mechanism: Secondary | ICD-10-CM | POA: Insufficient documentation

## 2013-05-17 DIAGNOSIS — M25519 Pain in unspecified shoulder: Secondary | ICD-10-CM | POA: Insufficient documentation

## 2013-05-17 DIAGNOSIS — R21 Rash and other nonspecific skin eruption: Secondary | ICD-10-CM | POA: Insufficient documentation

## 2013-05-17 MED ORDER — MELOXICAM 15 MG PO TABS: 15.0000 mg | ORAL_TABLET | Freq: Every day | ORAL | Status: DC

## 2013-05-17 MED ORDER — HYDROCODONE-ACETAMINOPHEN 5-325 MG PO TABS: 1.0000 | ORAL_TABLET | ORAL | Status: DC | PRN

## 2013-05-17 NOTE — ED Notes (Signed)
Pt c/o upper right side back pain x 2 days worse with movement

## 2013-05-17 NOTE — ED Provider Notes (Signed)
CSN: [id number]     Arrival date & time 05/17/13  1213 History  This chart was scribed for non-physician practitioner Arthor Captain, PA-C working with Shelda Jakes, MD by Valera Castle, ED scribe. This patient was seen in room TR09C/TR09C and the patient's care was started at 1:02 PM.    Chief Complaint  Patient presents with  . Back Pain    The history is provided by the patient. No language interpreter was used.   HPI Comments: Amy Heath is a 32 y.o. female who presents to the Emergency Department complaining of sudden, aching, constant, right sided, upper back pain, onset 2 days ago before she went to lay down in bed. She reports the pain is exacerbated with movement of her shoulders and back, as well as deep breathing and coughing. She reports being able to ambulate. She denies pain when turning her neck. She denies any recent injury or fall. She denies LE swelling, h/o clotting problems, and recent respiratory infections. She reports taking Ibuprofen, with little relief.   She also reports an itchy rash below bilateral breasts. She states she thinks water exacerbates the area. She reports taking Benadryl, with some relief.   She denies any other associated symptoms. She has a h/o sickle cell trait, and ovarian cyst, but denies any pertinent medical history.   PCP - No PCP Per Patient  Past Medical History  Diagnosis Date  . Ovarian cyst   . Sickle cell trait    Past Surgical History  Procedure Laterality Date  . Ovarian cyst surgery    . Laparoscopy N/A 10/11/2012    Procedure: LAPAROSCOPY OPERATIVE;  Surgeon: Lesly Dukes, MD;  Location: [facility];  Service: Gynecology;  Laterality: N/A;  . Laparotomy N/A 10/11/2012    Procedure: EXPLORATORY LAPAROTOMY.  Removal of  Ectopic pregnancy.  ;  Surgeon: Lesly Dukes, MD;  Location: [facility];  Service: Gynecology;  Laterality: N/A;  . Lysis of adhesion N/A 10/11/2012    Procedure: LYSIS OF ADHESION;  Surgeon: Lesly Dukes,  MD;  Location: [facility];  Service: Gynecology;  Laterality: N/A;  . Unilateral salpingectomy Right 10/11/2012    Procedure: UNILATERAL SALPINGECTOMY;  Surgeon: Lesly Dukes, MD;  Location: [facility];  Service: Gynecology;  Laterality: Right;   Family History  Problem Relation Age of Onset  . Alcohol abuse Neg Hx    History  Substance Use Topics  . Smoking status: Never Smoker   . Smokeless tobacco: Not on file  . Alcohol Use: No   OB History   Grav Para Term Preterm Abortions TAB SAB Ect Mult Living   7 6 6  1   1  6      Review of Systems  Musculoskeletal: Positive for back pain (right sided, upper). Negative for gait problem and neck pain.  Skin: Positive for rash (below bilateral breasts).  All other systems reviewed and are negative.    Allergies  Review of patient's allergies indicates no known allergies.  Home Medications   Current Outpatient Rx  Name  Route  Sig  Dispense  Refill  . acetaminophen (TYLENOL) 500 MG tablet   Oral   Take 1,000 mg by mouth every 6 (six) hours as needed for pain.         . diphenhydrAMINE (BENADRYL) 25 MG tablet   Oral   Take 25 mg by mouth every 6 (six) hours as needed for itching.         . hydrOXYzine (ATARAX/VISTARIL) 25  MG tablet   Oral   Take 1-2 tablets (25-50 mg total) by mouth every 6 (six) hours as needed for itching (may cause drowsiness).   30 tablet   0    Triage Vitals: BP 125/85  Pulse 85  Temp(Src) 98.4 F (36.9 C) (Oral)  Resp 16  Wt 191 lb 12.8 oz (87 kg)  BMI 35.07 kg/m2  SpO2 100%  Physical Exam  Nursing note and vitals reviewed. Constitutional: She is oriented to person, place, and time. She appears well-developed and well-nourished. No distress.  HENT:  Head: Normocephalic and atraumatic.  Eyes: EOM are normal.  Neck: Neck supple. No tracheal deviation present.  Cardiovascular: Normal rate.   Pulmonary/Chest: Effort normal. No respiratory distress.  Musculoskeletal: Normal range of motion.  No  midline spinal tenderness. Palpable spasm over right rhomboid and middle trapezius muscles. Pain reproducible with palpation. Pain with movement of right shoulder blade.   Neurological: She is alert and oriented to person, place, and time.  Skin: Skin is warm and dry.  Psychiatric: She has a normal mood and affect. Her behavior is normal.    ED Course  Procedures (including critical care time)  DIAGNOSTIC STUDIES: Oxygen Saturation is 100% on room air, normal by my interpretation.    COORDINATION OF CARE: 1:07 PM-Discussed treatment plan with pt at bedside and pt agreed to plan. Advised pt to get a massage.   Labs Review Labs Reviewed - No data to display Imaging Review No results found.  EKG Interpretation   None      No orders of the defined types were placed in this encounter.    MDM   1. Back pain   2. Trigger point of right shoulder region    Patient with trigger points of R shoulder blade and upper back. Non pleuritics and reproducible. I feel this is muscular in origin. Do not suspect PE. Will d/c with pain medication. Recommend massage therapy    I personally performed the services described in this documentation, which was scribed in my presence. The recorded information has been reviewed and is accurate.    Arthor Captain, PA-C 05/17/13 1336

## 2013-05-19 NOTE — ED Provider Notes (Signed)
Medical screening examination/treatment/procedure(s) were performed by non-physician practitioner and as supervising physician I was immediately available for consultation/collaboration.  EKG Interpretation   None         Shelda Jakes, MD 05/19/13 [phone]

## 2013-05-25 ENCOUNTER — Other Ambulatory Visit: Payer: Self-pay

## 2013-09-18 ENCOUNTER — Encounter (HOSPITAL_COMMUNITY): Payer: Self-pay | Admitting: *Deleted

## 2013-09-18 ENCOUNTER — Inpatient Hospital Stay (HOSPITAL_COMMUNITY)
Admission: AD | Admit: 2013-09-18 | Discharge: 2013-09-18 | Disposition: A | Discharge status: Home / Self Care | Payer: Medicaid Other | Source: Ambulatory Visit | Attending: Obstetrics & Gynecology | Admitting: Obstetrics & Gynecology

## 2013-09-18 DIAGNOSIS — E860 Dehydration: Secondary | ICD-10-CM | POA: Insufficient documentation

## 2013-09-18 DIAGNOSIS — D573 Sickle-cell trait: Secondary | ICD-10-CM | POA: Insufficient documentation

## 2013-09-18 DIAGNOSIS — A088 Other specified intestinal infections: Secondary | ICD-10-CM | POA: Insufficient documentation

## 2013-09-18 DIAGNOSIS — A084 Viral intestinal infection, unspecified: Secondary | ICD-10-CM

## 2013-09-18 LAB — COMPREHENSIVE METABOLIC PANEL
ALBUMIN: 4.1 g/dL (ref 3.5–5.2)
ALK PHOS: 105 U/L (ref 39–117)
ALT: 13 U/L (ref 0–35)
AST: 14 U/L (ref 0–37)
BUN: 10 mg/dL (ref 6–23)
CO2: 18 mEq/L — ABNORMAL LOW (ref 19–32)
Calcium: 10.1 mg/dL (ref 8.4–10.5)
Chloride: 104 mEq/L (ref 96–112)
Creatinine, Ser: 0.59 mg/dL (ref 0.50–1.10)
GFR calc non Af Amer: >90 mL/min (ref >90)
Glucose, Bld: 271 mg/dL — ABNORMAL HIGH (ref 70–99)
POTASSIUM: 3.7 meq/L (ref 3.7–5.3)
SODIUM: 142 meq/L (ref 137–147)
TOTAL PROTEIN: 8.2 g/dL (ref 6.0–8.3)
Total Bilirubin: 0.3 mg/dL (ref 0.3–1.2)

## 2013-09-18 LAB — URINE MICROSCOPIC-ADD ON

## 2013-09-18 LAB — CBC
HCT: 34.4 % — ABNORMAL LOW (ref 36.0–46.0)
Hemoglobin: 11.6 g/dL — ABNORMAL LOW (ref 12.0–15.0)
MCH: 26.8 pg (ref 26.0–34.0)
MCHC: 33.7 g/dL (ref 30.0–36.0)
MCV: 79.4 fL (ref 78.0–100.0)
Platelets: 316 10*3/uL (ref 150–400)
RBC: 4.33 MIL/uL (ref 3.87–5.11)
RDW: 15.1 % (ref 11.5–15.5)
WBC: 9.9 10*3/uL (ref 4.0–10.5)

## 2013-09-18 LAB — URINALYSIS, ROUTINE W REFLEX MICROSCOPIC
BILIRUBIN URINE: NEGATIVE
GLUCOSE, UA: NEGATIVE mg/dL
Hgb urine dipstick: NEGATIVE
Nitrite: NEGATIVE
PH: 7 (ref 5.0–8.0)
Protein, ur: NEGATIVE mg/dL
SPECIFIC GRAVITY, URINE: 1.015 (ref 1.005–1.030)
Urobilinogen, UA: 0.2 mg/dL (ref 0.0–1.0)

## 2013-09-18 LAB — POCT PREGNANCY, URINE: Preg Test, Ur: NEGATIVE

## 2013-09-18 MED ORDER — DEXTROSE 5 % IN LACTATED RINGERS IV BOLUS
1000.0000 mL | Freq: Once | INTRAVENOUS | Status: AC
  Administered 2013-09-18: 1000 mL via INTRAVENOUS

## 2013-09-18 MED ORDER — FAMOTIDINE IN NACL 20-0.9 MG/50ML-% IV SOLN
20.0000 mg | Freq: Once | INTRAVENOUS | Status: AC
  Administered 2013-09-18: 20 mg via INTRAVENOUS
  Filled 2013-09-18: qty 50

## 2013-09-18 MED ORDER — LACTATED RINGERS IV BOLUS (SEPSIS)
1000.0000 mL | Freq: Once | INTRAVENOUS | Status: AC
  Administered 2013-09-18: 1000 mL via INTRAVENOUS

## 2013-09-18 MED ORDER — ONDANSETRON HCL 4 MG/2ML IJ SOLN
4.0000 mg | Freq: Once | INTRAMUSCULAR | Status: AC
  Administered 2013-09-18: 4 mg via INTRAVENOUS
  Filled 2013-09-18: qty 2

## 2013-09-18 MED ORDER — PROMETHAZINE HCL 25 MG PO TABS: 25.0000 mg | ORAL_TABLET | Freq: Four times a day (QID) | ORAL | Status: DC | PRN

## 2013-09-18 MED ORDER — PROMETHAZINE HCL 25 MG/ML IJ SOLN
12.5000 mg | Freq: Once | INTRAMUSCULAR | Status: AC
  Administered 2013-09-18: 12.5 mg via INTRAVENOUS
  Filled 2013-09-18: qty 1

## 2013-09-18 NOTE — MAU Provider Note (Signed)
History     CSN: [id number]  Arrival date and time: 09/18/13 1614   None     Chief Complaint  Patient presents with  . Emesis  . Nausea  . Diarrhea  . Abdominal Pain   HPI  Pt is a 33 yo [id number] non-pregnant female presenting with abdominal cramping, nausea and vomiting. Pt states that she awakened this morning at 6am experiencing what she qualifies as several episodes of non-bloody diarrhea. Shortly afterward she began to notice non-radiating epigastric abdominal pain which has been persistent since this time. In addition to the abdominal pain, the pt began to experience "countless" episodes of nausea and vomiting. Pt states that the most recent episode of emesis had some streaks of blood in it however there has been no frank blood noted. She arrives to the MAU uncomfortable, experiencing nausea and abdominal pain. She has not experienced any fevers, chest pain or shortness of breath. Pt has had a similar episode of dehydration in the past which was effectively treated with IV rehydration therapy. Pt denies eating anything unusual recently and denies alcohol or drug use.    OB History   Grav Para Term Preterm Abortions TAB SAB Ect Mult Living   '7 6 6  1   1  6      '$ Past Medical History  Diagnosis Date  . Ovarian cyst   . Sickle cell trait     Past Surgical History  Procedure Laterality Date  . Ovarian cyst surgery    . Laparoscopy N/A 10/11/2012    Procedure: LAPAROSCOPY OPERATIVE;  Surgeon: Guss Bunde, MD;  Location: [facility] [facility];  Service: Gynecology;  Laterality: N/A;  . Laparotomy N/A 10/11/2012    Procedure: EXPLORATORY LAPAROTOMY.  Removal of  Ectopic pregnancy.  ;  Surgeon: Guss Bunde, MD;  Location: [facility];  Service: Gynecology;  Laterality: N/A;  . Lysis of adhesion N/A 10/11/2012    Procedure: LYSIS OF ADHESION;  Surgeon: Guss Bunde, MD;  Location: [place] [facility];  Service: Gynecology;  Laterality: N/A;  . Unilateral salpingectomy Right 10/11/2012    Procedure:  UNILATERAL SALPINGECTOMY;  Surgeon: Guss Bunde, MD;  Location: [facility];  Service: Gynecology;  Laterality: Right;    Family History  Problem Relation Age of Onset  . Alcohol abuse Neg Hx   . COPD Mother     History  Substance Use Topics  . Smoking status: Never Smoker   . Smokeless tobacco: Not on file  . Alcohol Use: No    Allergies: No Known Allergies  Prescriptions prior to admission  Medication Sig Dispense Refill  . diphenhydrAMINE (BENADRYL) 25 MG tablet Take 25 mg by mouth every 6 (six) hours as needed for itching.        Results for orders placed during the hospital encounter of 09/18/13 (from the past 48 hour(s))  URINALYSIS, ROUTINE W REFLEX MICROSCOPIC     Status: Abnormal   Collection Time    09/18/13  4:30 PM      Result Value Ref Range   Color, Urine YELLOW  YELLOW   APPearance CLEAR  CLEAR   Specific Gravity, Urine 1.015  1.005 - 1.030   pH 7.0  5.0 - 8.0   Glucose, UA NEGATIVE  NEGATIVE mg/dL   Hgb urine dipstick NEGATIVE  NEGATIVE   Bilirubin Urine NEGATIVE  NEGATIVE   Ketones, ur >80 (*) NEGATIVE mg/dL   Protein, ur NEGATIVE  NEGATIVE mg/dL   Urobilinogen, UA 0.2  0.0 - 1.0 mg/dL  Nitrite NEGATIVE  NEGATIVE   Leukocytes, UA TRACE (*) NEGATIVE  URINE MICROSCOPIC-ADD ON     Status: Abnormal   Collection Time    09/18/13  4:30 PM      Result Value Ref Range   Squamous Epithelial / LPF FEW (*) RARE   WBC, UA 0-2  <3 WBC/hpf   Urine-Other AMORPHOUS URATES/PHOSPHATES    POCT PREGNANCY, URINE     Status: None   Collection Time    09/18/13  4:44 PM      Result Value Ref Range   Preg Test, Ur NEGATIVE  NEGATIVE   Comment:            THE SENSITIVITY OF THIS     METHODOLOGY IS >24 mIU/mL  CBC     Status: Abnormal   Collection Time    09/18/13  6:20 PM      Result Value Ref Range   WBC 9.9  4.0 - 10.5 K/uL   RBC 4.33  3.87 - 5.11 MIL/uL   Hemoglobin 11.6 (*) 12.0 - 15.0 g/dL   HCT 34.4 (*) 36.0 - 46.0 %   MCV 79.4  78.0 - 100.0 fL   MCH 26.8   26.0 - 34.0 pg   MCHC 33.7  30.0 - 36.0 g/dL   RDW 15.1  11.5 - 15.5 %   Platelets 316  150 - 400 K/uL  COMPREHENSIVE METABOLIC PANEL     Status: Abnormal   Collection Time    09/18/13  6:20 PM      Result Value Ref Range   Sodium 142  137 - 147 mEq/L   Potassium 3.7  3.7 - 5.3 mEq/L   Chloride 104  96 - 112 mEq/L   CO2 18 (*) 19 - 32 mEq/L   Glucose, Bld 271 (*) 70 - 99 mg/dL   BUN 10  6 - 23 mg/dL   Creatinine, Ser 0.59  0.50 - 1.10 mg/dL   Calcium 10.1  8.4 - 10.5 mg/dL   Total Protein 8.2  6.0 - 8.3 g/dL   Albumin 4.1  3.5 - 5.2 g/dL   AST 14  0 - 37 U/L   ALT 13  0 - 35 U/L   Alkaline Phosphatase 105  39 - 117 U/L   Total Bilirubin 0.3  0.3 - 1.2 mg/dL   GFR calc non Af Amer >90  >90 mL/min   GFR calc Af Amer >90  >90 mL/min   Comment: (NOTE)     The eGFR has been calculated using the CKD EPI equation.     This calculation has not been validated in all clinical situations.     eGFR's persistently <90 mL/min signify possible Chronic Kidney     Disease.    Review of Systems  Constitutional: Negative for fever and chills.  Respiratory: Negative for cough and shortness of breath.   Cardiovascular: Negative for chest pain.  Gastrointestinal: Positive for nausea, vomiting, abdominal pain and diarrhea.  Musculoskeletal: Negative for back pain.  Neurological: Negative for dizziness, weakness and headaches.  Psychiatric/Behavioral: The patient is nervous/anxious.    Physical Exam   Blood pressure 124/65, pulse 68, temperature 98.3 F (36.8 C), temperature source Oral, resp. rate 20, last menstrual period 08/24/2013, SpO2 100.00%.  Physical Exam  Constitutional: She is oriented to person, place, and time. She appears distressed (unable to sit still).  HENT:  Head: Normocephalic.  Eyes: Pupils are equal, round, and reactive to light.  Neck: Neck supple.  Respiratory: Effort  normal.  GI: Soft. She exhibits no distension. There is no tenderness. There is no rebound and no  guarding.  Musculoskeletal: Normal range of motion.  Neurological: She is alert and oriented to person, place, and time.  Skin: Skin is warm.  Psychiatric: Her behavior is normal.    MAU Course  Procedures None  MDM Urine shows dehydration; >80 ketones  Zofran IVP Phenergan IVP Pepcid  D5 LR LR bolus  Pt feels better following medications and fluid in MAU Pt tolerating PO fluids prior to discharge home   Assessment and Plan   A:  1. Viral gastroenteritis     P:  Discharge home in stable condition RX: phenergan If symptoms do not improve go to [facility] or [place] Good hand hygiene encouraged  Increase fluids slowly   Darrelyn Hillock Ludie Hudon, NP 09/19/2013, 9:49 AM

## 2013-09-18 NOTE — MAU Note (Signed)
Has been sick since 0700 this Am with N,V and  Diarrhea; c/o abdominal cramping and what sounds like epigastric burning;

## 2013-09-18 NOTE — MAU Note (Signed)
Patient states she started having nausea, vomiting, diarrhea and pain that radiates from the abdomen to epigastric pain.

## 2013-09-20 ENCOUNTER — Inpatient Hospital Stay (HOSPITAL_COMMUNITY)
Admission: EM | Admit: 2013-09-20 | Discharge: 2013-09-24 | DRG: 392 | Disposition: A | Discharge status: Home / Self Care | Payer: Self-pay | Attending: Family Medicine | Admitting: Family Medicine

## 2013-09-20 ENCOUNTER — Emergency Department (HOSPITAL_COMMUNITY): Payer: Medicaid Other

## 2013-09-20 ENCOUNTER — Encounter (HOSPITAL_COMMUNITY): Payer: Self-pay | Admitting: Emergency Medicine

## 2013-09-20 DIAGNOSIS — E876 Hypokalemia: Secondary | ICD-10-CM

## 2013-09-20 DIAGNOSIS — R109 Unspecified abdominal pain: Secondary | ICD-10-CM | POA: Diagnosis present

## 2013-09-20 DIAGNOSIS — Z23 Encounter for immunization: Secondary | ICD-10-CM

## 2013-09-20 DIAGNOSIS — Z8742 Personal history of other diseases of the female genital tract: Secondary | ICD-10-CM

## 2013-09-20 DIAGNOSIS — D573 Sickle-cell trait: Secondary | ICD-10-CM | POA: Diagnosis present

## 2013-09-20 DIAGNOSIS — A088 Other specified intestinal infections: Principal | ICD-10-CM | POA: Diagnosis present

## 2013-09-20 DIAGNOSIS — A084 Viral intestinal infection, unspecified: Secondary | ICD-10-CM | POA: Diagnosis present

## 2013-09-20 DIAGNOSIS — R197 Diarrhea, unspecified: Secondary | ICD-10-CM

## 2013-09-20 DIAGNOSIS — R112 Nausea with vomiting, unspecified: Secondary | ICD-10-CM | POA: Diagnosis present

## 2013-09-20 LAB — BASIC METABOLIC PANEL
BUN: 10 mg/dL (ref 6–23)
CO2: 23 meq/L (ref 19–32)
Calcium: 9.2 mg/dL (ref 8.4–10.5)
Chloride: 100 mEq/L (ref 96–112)
Creatinine, Ser: 0.76 mg/dL (ref 0.50–1.10)
GFR calc Af Amer: >90 mL/min (ref >90)
Glucose, Bld: 107 mg/dL — ABNORMAL HIGH (ref 70–99)
POTASSIUM: 3.6 meq/L — AB (ref 3.7–5.3)
SODIUM: 139 meq/L (ref 137–147)

## 2013-09-20 LAB — MAGNESIUM: Magnesium: 1.9 mg/dL (ref 1.5–2.5)

## 2013-09-20 LAB — URINALYSIS, ROUTINE W REFLEX MICROSCOPIC
Bilirubin Urine: NEGATIVE
Glucose, UA: NEGATIVE mg/dL
Hgb urine dipstick: NEGATIVE
Nitrite: NEGATIVE
PH: 8.5 — AB (ref 5.0–8.0)
Protein, ur: 30 mg/dL — AB
Specific Gravity, Urine: 1.019 (ref 1.005–1.030)
Urobilinogen, UA: 1 mg/dL (ref 0.0–1.0)

## 2013-09-20 LAB — COMPREHENSIVE METABOLIC PANEL
ALBUMIN: 4.3 g/dL (ref 3.5–5.2)
ALT: 11 U/L (ref 0–35)
AST: 14 U/L (ref 0–37)
Alkaline Phosphatase: 102 U/L (ref 39–117)
BILIRUBIN TOTAL: 0.5 mg/dL (ref 0.3–1.2)
BUN: 13 mg/dL (ref 6–23)
CALCIUM: 9.9 mg/dL (ref 8.4–10.5)
CHLORIDE: 96 meq/L (ref 96–112)
CO2: 24 mEq/L (ref 19–32)
CREATININE: 0.69 mg/dL (ref 0.50–1.10)
GFR calc Af Amer: >90 mL/min (ref >90)
GFR calc non Af Amer: >90 mL/min (ref >90)
Glucose, Bld: 126 mg/dL — ABNORMAL HIGH (ref 70–99)
Potassium: 2.9 mEq/L — CL (ref 3.7–5.3)
Sodium: 139 mEq/L (ref 137–147)
Total Protein: 8.8 g/dL — ABNORMAL HIGH (ref 6.0–8.3)

## 2013-09-20 LAB — URINE MICROSCOPIC-ADD ON

## 2013-09-20 LAB — CBC WITH DIFFERENTIAL/PLATELET
BASOS PCT: 0 % (ref 0–1)
Basophils Absolute: 0 10*3/uL (ref 0.0–0.1)
EOS PCT: 0 % (ref 0–5)
Eosinophils Absolute: 0 10*3/uL (ref 0.0–0.7)
HEMATOCRIT: 35 % — AB (ref 36.0–46.0)
HEMOGLOBIN: 12.2 g/dL (ref 12.0–15.0)
Lymphocytes Relative: 24 % (ref 12–46)
Lymphs Abs: 2.8 10*3/uL (ref 0.7–4.0)
MCH: 27 pg (ref 26.0–34.0)
MCHC: 34.9 g/dL (ref 30.0–36.0)
MCV: 77.4 fL — AB (ref 78.0–100.0)
MONO ABS: 1.2 10*3/uL — AB (ref 0.1–1.0)
MONOS PCT: 11 % (ref 3–12)
NEUTROS ABS: 7.6 10*3/uL (ref 1.7–7.7)
Neutrophils Relative %: 65 % (ref 43–77)
Platelets: 410 10*3/uL — ABNORMAL HIGH (ref 150–400)
RBC: 4.52 MIL/uL (ref 3.87–5.11)
RDW: 14.9 % (ref 11.5–15.5)
WBC: 11.7 10*3/uL — ABNORMAL HIGH (ref 4.0–10.5)

## 2013-09-20 LAB — POC URINE PREG, ED: PREG TEST UR: NEGATIVE

## 2013-09-20 LAB — LIPASE, BLOOD: Lipase: 21 U/L (ref 11–59)

## 2013-09-20 MED ORDER — HEPARIN SODIUM (PORCINE) 5000 UNIT/ML IJ SOLN
5000.0000 [IU] | Freq: Three times a day (TID) | INTRAMUSCULAR | Status: DC
  Administered 2013-09-20 – 2013-09-24 (×10): 5000 [IU] via SUBCUTANEOUS
  Filled 2013-09-20 (×14): qty 1

## 2013-09-20 MED ORDER — LACTATED RINGERS IV BOLUS (SEPSIS)
1000.0000 mL | Freq: Once | INTRAVENOUS | Status: AC
  Administered 2013-09-20: 1000 mL via INTRAVENOUS

## 2013-09-20 MED ORDER — ONDANSETRON HCL 4 MG/2ML IJ SOLN
4.0000 mg | Freq: Once | INTRAMUSCULAR | Status: AC
  Administered 2013-09-20: 4 mg via INTRAVENOUS
  Filled 2013-09-20: qty 2

## 2013-09-20 MED ORDER — PANTOPRAZOLE SODIUM 40 MG IV SOLR
40.0000 mg | Freq: Once | INTRAVENOUS | Status: AC
  Administered 2013-09-20: 40 mg via INTRAVENOUS
  Filled 2013-09-20: qty 40

## 2013-09-20 MED ORDER — MORPHINE SULFATE 4 MG/ML IJ SOLN
4.0000 mg | Freq: Once | INTRAMUSCULAR | Status: AC
  Administered 2013-09-20: 4 mg via INTRAVENOUS
  Filled 2013-09-20: qty 1

## 2013-09-20 MED ORDER — ONDANSETRON 8 MG/NS 50 ML IVPB
8.0000 mg | Freq: Four times a day (QID) | INTRAVENOUS | Status: DC | PRN
  Administered 2013-09-20 – 2013-09-24 (×10): 8 mg via INTRAVENOUS
  Filled 2013-09-20 (×12): qty 8

## 2013-09-20 MED ORDER — ONDANSETRON HCL 4 MG PO TABS: 4.0000 mg | ORAL_TABLET | Freq: Four times a day (QID) | ORAL | Status: DC | PRN

## 2013-09-20 MED ORDER — ONDANSETRON 8 MG PO TBDP
8.0000 mg | ORAL_TABLET | Freq: Once | ORAL | Status: AC
  Administered 2013-09-20: 8 mg via ORAL
  Filled 2013-09-20: qty 1

## 2013-09-20 MED ORDER — PANTOPRAZOLE SODIUM 40 MG IV SOLR
40.0000 mg | Freq: Two times a day (BID) | INTRAVENOUS | Status: DC
  Administered 2013-09-20 – 2013-09-24 (×8): 40 mg via INTRAVENOUS
  Filled 2013-09-20 (×9): qty 40

## 2013-09-20 MED ORDER — SODIUM CHLORIDE 0.9 % IJ SOLN
3.0000 mL | Freq: Two times a day (BID) | INTRAMUSCULAR | Status: DC
  Administered 2013-09-20 – 2013-09-24 (×8): 3 mL via INTRAVENOUS

## 2013-09-20 MED ORDER — POTASSIUM CHLORIDE 10 MEQ/100ML IV SOLN
10.0000 meq | INTRAVENOUS | Status: AC
  Administered 2013-09-20 (×2): 10 meq via INTRAVENOUS
  Filled 2013-09-20 (×2): qty 100

## 2013-09-20 MED ORDER — POTASSIUM CHLORIDE CRYS ER 20 MEQ PO TBCR
40.0000 meq | EXTENDED_RELEASE_TABLET | Freq: Once | ORAL | Status: DC
  Filled 2013-09-20: qty 2

## 2013-09-20 MED ORDER — KCL-LACTATED RINGERS 20 MEQ/L IV SOLN
INTRAVENOUS | Status: DC
  Administered 2013-09-20 – 2013-09-21 (×2): via INTRAVENOUS
  Filled 2013-09-20 (×6): qty 1000

## 2013-09-20 MED ORDER — MORPHINE SULFATE 2 MG/ML IJ SOLN
2.0000 mg | INTRAMUSCULAR | Status: DC | PRN
  Administered 2013-09-20 – 2013-09-24 (×17): 2 mg via INTRAVENOUS
  Filled 2013-09-20 (×17): qty 1

## 2013-09-20 MED ORDER — POTASSIUM CHLORIDE 10 MEQ/100ML IV SOLN
10.0000 meq | INTRAVENOUS | Status: AC
  Administered 2013-09-20 – 2013-09-21 (×4): 10 meq via INTRAVENOUS
  Filled 2013-09-20 (×4): qty 100

## 2013-09-20 MED ORDER — KCL-LACTATED RINGERS 20 MEQ/L IV SOLN
INTRAVENOUS | Status: DC
  Administered 2013-09-20: 18:00:00 via INTRAVENOUS
  Filled 2013-09-20 (×2): qty 1000

## 2013-09-20 MED ORDER — GI COCKTAIL ~~LOC~~
30.0000 mL | Freq: Three times a day (TID) | ORAL | Status: DC | PRN
  Administered 2013-09-20 – 2013-09-23 (×4): 30 mL via ORAL
  Filled 2013-09-20 (×4): qty 30

## 2013-09-20 NOTE — MAU Provider Note (Signed)
Attestation of Attending Supervision of Advanced Practitioner (CNM/NP): Evaluation and management procedures were performed by the Advanced Practitioner under my supervision and collaboration.  I have reviewed the Advanced Practitioner's note and chart, and I agree with the management and plan.  HARRAWAY-SMITH, [place] 5:07 PM

## 2013-09-20 NOTE — ED Notes (Signed)
Patient presents from home via EMS with a chief complaint of nausea, vomiting, and diarrhea since Sunday. Patient was seen at [facility] on 09/18/13 and was prescribed phenergan for which she has been unable to keep down. Patient reports she's been unable to eat or drink for 3 days now.

## 2013-09-20 NOTE — ED Provider Notes (Signed)
CSN: [id number]     Arrival date & time 09/20/13  1351 History   First MD Initiated Contact with Patient 09/20/13 1541     Chief Complaint  Patient presents with  . Emesis  . Diarrhea     (Consider location/radiation/quality/duration/timing/severity/associated sxs/prior Treatment) HPI Comments: 33 year old female presents with abdominal pain, vomiting, and diarrhea for the past 4 days. 2 days ago she went to [facility] hospital and was given antibiotics, fluids and felt better. She states partially went away but not all the way. She was given Phenergan to go home with but is unable to keep any fluids down and still having vomiting. She tried the phenergan twice but vomited it back up each time. Has not taken anything for pain as she's afraid she will vomit it back up. She states she's been vomiting blood recently. No blood in her stools. She is poor at localizing where the pain is. Denies any recent antibiotics or possible poor food ingestion. The patient was given ODT Zofran in triage, she states she vomited afterwards.   Past Medical History  Diagnosis Date  . Ovarian cyst   . Sickle cell trait    Past Surgical History  Procedure Laterality Date  . Ovarian cyst surgery    . Laparoscopy N/A 10/11/2012    Procedure: LAPAROSCOPY OPERATIVE;  Surgeon: Guss Bunde, MD;  Location: [facility];  Service: Gynecology;  Laterality: N/A;  . Laparotomy N/A 10/11/2012    Procedure: EXPLORATORY LAPAROTOMY.  Removal of  Ectopic pregnancy.  ;  Surgeon: Guss Bunde, MD;  Location: [place] [facility];  Service: Gynecology;  Laterality: N/A;  . Lysis of adhesion N/A 10/11/2012    Procedure: LYSIS OF ADHESION;  Surgeon: Guss Bunde, MD;  Location: [place] [facility];  Service: Gynecology;  Laterality: N/A;  . Unilateral salpingectomy Right 10/11/2012    Procedure: UNILATERAL SALPINGECTOMY;  Surgeon: Guss Bunde, MD;  Location: [place] [facility];  Service: Gynecology;  Laterality: Right;   Family History  Problem Relation Age of Onset   . Alcohol abuse Neg Hx   . COPD Mother    History  Substance Use Topics  . Smoking status: Never Smoker   . Smokeless tobacco: Not on file  . Alcohol Use: No   OB History   Grav Para Term Preterm Abortions TAB SAB Ect Mult Living   7 6 6  1   1  6      Review of Systems  Constitutional: Negative for fever.  Cardiovascular: Positive for chest pain.  Gastrointestinal: Positive for nausea, vomiting, abdominal pain and diarrhea. Negative for blood in stool.  Genitourinary: Negative for dysuria.  All other systems reviewed and are negative.      Allergies  Review of patient's allergies indicates no known allergies.  Home Medications   Current Outpatient Rx  Name  Route  Sig  Dispense  Refill  . acetaminophen (TYLENOL) 500 MG tablet   Oral   Take 1,000 mg by mouth every 6 (six) hours as needed for mild pain.         . diphenhydrAMINE (BENADRYL) 25 MG tablet   Oral   Take 25 mg by mouth every 6 (six) hours as needed for itching.         . promethazine (PHENERGAN) 25 MG tablet   Oral   Take 1 tablet (25 mg total) by mouth every 6 (six) hours as needed for nausea or vomiting.   15 tablet   0    BP 119/70  Pulse  85  Temp(Src) 98.5 F (36.9 C) (Oral)  Resp 18  SpO2 99%  LMP 08/24/2013 Physical Exam  Nursing note and vitals reviewed. Constitutional: She is oriented to person, place, and time. She appears well-developed and well-nourished.  Rolling around in bed  HENT:  Head: Normocephalic and atraumatic.  Right Ear: External ear normal.  Left Ear: External ear normal.  Nose: Nose normal.  Eyes: Right eye exhibits no discharge. Left eye exhibits no discharge.  Cardiovascular: Normal rate, regular rhythm and normal heart sounds.   Pulmonary/Chest: Effort normal and breath sounds normal. She exhibits tenderness.  Abdominal: Soft. There is tenderness.  Soft abdomen with mild, diffuse tenderness  Neurological: She is alert and oriented to person, place, and  time.  Skin: Skin is warm and dry.    ED Course  Procedures (including critical care time) Labs Review Labs Reviewed  CBC WITH DIFFERENTIAL - Abnormal; Notable for the following:    WBC 11.7 (*)    HCT 35.0 (*)    MCV 77.4 (*)    Platelets 410 (*)    Monocytes Absolute 1.2 (*)    All other components within normal limits  COMPREHENSIVE METABOLIC PANEL - Abnormal; Notable for the following:    Potassium 2.9 (*)    Glucose, Bld 126 (*)    Total Protein 8.8 (*)    All other components within normal limits  URINALYSIS, ROUTINE W REFLEX MICROSCOPIC - Abnormal; Notable for the following:    APPearance CLOUDY (*)    pH 8.5 (*)    Ketones, ur >80 (*)    Protein, ur 30 (*)    Leukocytes, UA MODERATE (*)    All other components within normal limits  URINE MICROSCOPIC-ADD ON - Abnormal; Notable for the following:    Squamous Epithelial / LPF MANY (*)    Bacteria, UA MANY (*)    All other components within normal limits  POC URINE PREG, ED   Imaging Review Dg Abd Acute W/chest  09/20/2013   CLINICAL DATA:  Chest pain. Hematemesis. Question Mallory-Weiss tear.  EXAM: ACUTE ABDOMEN SERIES (ABDOMEN 2 VIEW & CHEST 1 VIEW)  COMPARISON:  None available for direct review. Report of 05/20/2011 exam is available.  FINDINGS: Clear lungs.  Heart size top-normal.  No findings of pneumomediastinum.  Nonspecific bowel gas pattern without plain film evidence of bowel obstruction or free intraperitoneal air.  Secretion and gas-filled stomach.  No osseous abnormality noted. Minimal curvature lumbar spine may be positional.  IMPRESSION: No acute abnormality.  Please see above.   Electronically Signed   By: Chauncey Cruel M.D.   On: 09/20/2013 17:11     EKG Interpretation None      MDM   Final diagnoses:  Nausea vomiting and diarrhea  Hypokalemia    Patient with gastroenteritis and hypokalemia. Unable to keep down PO and still vomiting some and unable to keep PO in ED. Abd exam benign, do not feel  she needs imaging. Given her c/o chest pain with vomiting an AAS was obtained, shows no free air, normal mediastinum. Low concern for acute chest issue. Will hydrate and bring into hospital for electrolyte replacement and hydration    Ephraim Hamburger, MD 09/21/13 1512

## 2013-09-21 DIAGNOSIS — R197 Diarrhea, unspecified: Secondary | ICD-10-CM

## 2013-09-21 DIAGNOSIS — R112 Nausea with vomiting, unspecified: Secondary | ICD-10-CM | POA: Diagnosis present

## 2013-09-21 DIAGNOSIS — R109 Unspecified abdominal pain: Secondary | ICD-10-CM | POA: Diagnosis present

## 2013-09-21 LAB — BASIC METABOLIC PANEL
BUN: 10 mg/dL (ref 6–23)
CO2: 25 meq/L (ref 19–32)
CREATININE: 0.87 mg/dL (ref 0.50–1.10)
Calcium: 9 mg/dL (ref 8.4–10.5)
Chloride: 102 mEq/L (ref 96–112)
GFR calc Af Amer: >90 mL/min (ref >90)
GFR calc non Af Amer: 87 mL/min — ABNORMAL LOW (ref >90)
Glucose, Bld: 75 mg/dL (ref 70–99)
Potassium: 4 mEq/L (ref 3.7–5.3)
Sodium: 140 mEq/L (ref 137–147)

## 2013-09-21 LAB — CBC
HCT: 30.5 % — ABNORMAL LOW (ref 36.0–46.0)
Hemoglobin: 10.3 g/dL — ABNORMAL LOW (ref 12.0–15.0)
MCH: 26.9 pg (ref 26.0–34.0)
MCHC: 33.8 g/dL (ref 30.0–36.0)
MCV: 79.6 fL (ref 78.0–100.0)
Platelets: 326 10*3/uL (ref 150–400)
RBC: 3.83 MIL/uL — ABNORMAL LOW (ref 3.87–5.11)
RDW: 15.2 % (ref 11.5–15.5)
WBC: 8 10*3/uL (ref 4.0–10.5)

## 2013-09-21 MED ORDER — VITAMINS A & D EX OINT
TOPICAL_OINTMENT | CUTANEOUS | Status: AC
  Administered 2013-09-21: 5
  Filled 2013-09-21: qty 5

## 2013-09-21 NOTE — Progress Notes (Signed)
[facility] PROGRESS NOTE  Amy Heath GBT:[id number] DOB: 09-06-1980 DOA: 09/20/2013 PCP: No PCP Per Patient  Assessment/Plan: 1. Hypokalemia  Resolved after repletion - Most likely 2ary to nausea and emesis.  2. Abdominal pain  No significant abdominal abnormality on exam Liver enzymes within normal limits, Lipase WNL IV Protonix twice a day  3. Intractable nausea and vomiting  Likely viral gastroenteritis  IV Zofran IV Protonix   DVT Prophylaxis: subcutaneous Heparin  Nutrition: N.p.o.  Code Status: full Family Communication: No family at bedside Disposition Plan: Pending resolution of nausea/emesis and improvement in oral intake.   Consultants:  None  Procedures:  None  Antibiotics:  None  HPI/Subjective: No new complaints. States that she is feeling better. Still not eating well though  Objective: Filed Vitals:   09/21/13 1339  BP: 101/53  Pulse: 64  Temp: 98.3 F (36.8 C)  Resp: 18    Intake/Output Summary (Last 24 hours) at 09/21/13 1504 Last data filed at 09/21/13 1108  Gross per 24 hour  Intake      3 ml  Output      0 ml  Net      3 ml   Filed Weights   09/20/13 1937  Weight: 81.421 kg (179 lb 8 oz)    Exam:   General:  Pt in nAD, alert and awake  Cardiovascular: RRR, no MRG  Respiratory: CTA BL, no wheezes  Abdomen: soft, ND, + bowel sounds  Musculoskeletal: no cyanosis or clubbing   Data Reviewed: Basic Metabolic Panel:  Recent Labs Lab 09/18/13 1820 09/20/13 1420 09/20/13 2203 09/21/13 1158  NA 142 139 139 140  K 3.7 2.9* 3.6* 4.0  CL 104 96 100 102  CO2 18* 24 23 25   GLUCOSE 271* 126* 107* 75  BUN 10 13 10 10   CREATININE 0.59 0.69 0.76 0.87  CALCIUM 10.1 9.9 9.2 9.0  MG  --  1.9  --   --    Liver Function Tests:  Recent Labs Lab 09/18/13 1820 09/20/13 1420  AST 14 14  ALT 13 11  ALKPHOS 105 102  BILITOT 0.3 0.5  PROT 8.2 8.8*  ALBUMIN 4.1 4.3    Recent Labs Lab 09/20/13 1420  LIPASE 21    No results found for this basename: AMMONIA,  in the last 168 hours CBC:  Recent Labs Lab 09/18/13 1820 09/20/13 1420 09/21/13 1158  WBC 9.9 11.7* 8.0  NEUTROABS  --  7.6  --   HGB 11.6* 12.2 10.3*  HCT 34.4* 35.0* 30.5*  MCV 79.4 77.4* 79.6  PLT 316 410* 326   Cardiac Enzymes: No results found for this basename: CKTOTAL, CKMB, CKMBINDEX, TROPONINI,  in the last 168 hours BNP (last 3 results) No results found for this basename: PROBNP,  in the last 8760 hours CBG: No results found for this basename: GLUCAP,  in the last 168 hours  No results found for this or any previous visit (from the past 240 hour(s)).   Studies: Dg Abd Acute W/chest  09/20/2013   CLINICAL DATA:  Chest pain. Hematemesis. Question Mallory-Weiss tear.  EXAM: ACUTE ABDOMEN SERIES (ABDOMEN 2 VIEW & CHEST 1 VIEW)  COMPARISON:  None available for direct review. Report of 05/20/2011 exam is available.  FINDINGS: Clear lungs.  Heart size top-normal.  No findings of pneumomediastinum.  Nonspecific bowel gas pattern without plain film evidence of bowel obstruction or free intraperitoneal air.  Secretion and gas-filled stomach.  No osseous abnormality noted. Minimal curvature lumbar spine may  be positional.  IMPRESSION: No acute abnormality.  Please see above.   Electronically Signed   By: Chauncey Cruel M.D.   On: 09/20/2013 17:11    Scheduled Meds: . heparin  5,000 Units Subcutaneous 3 times per day  . pantoprazole (PROTONIX) IV  40 mg Intravenous Q12H  . sodium chloride  3 mL Intravenous Q12H   Continuous Infusions: . lactated ringers with KCl 20 mEq/L 75 mL/hr at 09/21/13 0144    Principal Problem:   Hypokalemia Active Problems:   Intractable nausea and vomiting   Abdominal pain, unspecified site    Time spent: > 35 minutes    Amy Heath  [facility] Pager [phone]. If 7PM-7AM, please contact night-coverage at [email or web address], password [facility] 09/21/2013, 3:04 PM  LOS: 1 day

## 2013-09-21 NOTE — Progress Notes (Signed)
UR completed. Patient changed to inpatient- requiring IVF @ 75cc/hr, IV antiemetics and IV pain medication

## 2013-09-21 NOTE — Progress Notes (Signed)
Pt refuses to eat b/c she sts her "belly doesn't feel right"; drinking liquids with no problems, pt ambulates with no problem

## 2013-09-21 NOTE — H&P (Signed)
[facility] History and Physical  Patient: Amy Heath  IRC:[id number]  DOB: Apr 19, 1981  DOS: the patient was seen and examined on 09/20/2013 PCP: No PCP Per Patient  Chief Complaint: Abdominal pain  HPI: Amy Heath is a 33 y.o. female with Past medical history of ovarian cyst sickle cell trait. The patient is coming from home The patient presented with complaints of abdominal pain and nausea and vomiting. She mentions that she started with nausea vomiting and diarrhea since last 4 days. She denies any blood in her bowel or vomit. She denies any black color bowel movements. She denies any outside food or recent change in her medications or any sick contacts or travel. She went with this complaint to [facility] and was given fluids and discharged on Phenergan but due to persistent vomiting she cannot maintain Phenergan down and presented to the ED today. She denies any complaints of dizziness, vertigo, possibly gout person to be episode, chest pain shortness of breath. She opens of epigastric pain which is a burning dull ache which is constant present and worsens with deep breath. She denies any cough or sore throat no fever no chills.  Review of Systems: as mentioned in the history of present illness.  A Comprehensive review of the other systems is negative.  Past Medical History  Diagnosis Date  . Ovarian cyst   . Sickle cell trait    Past Surgical History  Procedure Laterality Date  . Ovarian cyst surgery    . Laparoscopy N/A 10/11/2012    Procedure: LAPAROSCOPY OPERATIVE;  Surgeon: Guss Bunde, MD;  Location: [facility];  Service: Gynecology;  Laterality: N/A;  . Laparotomy N/A 10/11/2012    Procedure: EXPLORATORY LAPAROTOMY.  Removal of  Ectopic pregnancy.  ;  Surgeon: Guss Bunde, MD;  Location: [facility];  Service: Gynecology;  Laterality: N/A;  . Lysis of adhesion N/A 10/11/2012    Procedure: LYSIS OF ADHESION;  Surgeon: Guss Bunde, MD;  Location: [facility];   Service: Gynecology;  Laterality: N/A;  . Unilateral salpingectomy Right 10/11/2012    Procedure: UNILATERAL SALPINGECTOMY;  Surgeon: Guss Bunde, MD;  Location: [facility];  Service: Gynecology;  Laterality: Right;   Social History:  reports that she has never smoked. She does not have any smokeless tobacco history on file. She reports that she uses illicit drugs (Marijuana). She reports that she does not drink alcohol. Independent for most of her  ADL.  No Known Allergies  Family History  Problem Relation Age of Onset  . Alcohol abuse Neg Hx   . COPD Mother     Prior to Admission medications   Medication Sig Start Date End Date Taking? Authorizing Provider  acetaminophen (TYLENOL) 500 MG tablet Take 1,000 mg by mouth every 6 (six) hours as needed for mild pain.   Yes Historical Provider, MD  diphenhydrAMINE (BENADRYL) 25 MG tablet Take 25 mg by mouth every 6 (six) hours as needed for itching.   Yes Historical Provider, MD  promethazine (PHENERGAN) 25 MG tablet Take 1 tablet (25 mg total) by mouth every 6 (six) hours as needed for nausea or vomiting. 09/18/13  Yes Ronnald Ramp, NP    Physical Exam: Filed Vitals:   09/20/13 1356 09/20/13 1937  BP: 119/70 154/92  Pulse: 85 90  Temp: 98.5 F (36.9 C) 98.5 F (36.9 C)  TempSrc: Oral Oral  Resp: 18 20  Height:  5\' 1"  (1.549 m)  Weight:  81.421 kg (179 lb 8 oz)  SpO2: 99% 100%    General: Alert, Awake and Oriented to Time, Place and Person. Appear in mild distress Eyes: PERRL ENT: Oral Mucosa clear moist. Neck: No JVD Cardiovascular: S1 and S2 Present, no Murmur, Peripheral Pulses Present Respiratory: Bilateral Air entry equal and Decreased, Clear to Auscultation,  No Crackles, no wheezes Abdomen: Bowel Sound Present, Soft and diffusely minimally tender, no guarding no rigidity Skin: No Rash Extremities: No Pedal edema, no calf tenderness Neurologic: Grossly Unremarkable. Labs on Admission:  CBC:  Recent Labs Lab  09/18/13 1820 09/20/13 1420  WBC 9.9 11.7*  NEUTROABS  --  7.6  HGB 11.6* 12.2  HCT 34.4* 35.0*  MCV 79.4 77.4*  PLT 316 410*    CMP     Component Value Date/Time   NA 139 09/20/2013 2203   K 3.6* 09/20/2013 2203   CL 100 09/20/2013 2203   CO2 23 09/20/2013 2203   GLUCOSE 107* 09/20/2013 2203   BUN 10 09/20/2013 2203   CREATININE 0.76 09/20/2013 2203   CALCIUM 9.2 09/20/2013 2203   PROT 8.8* 09/20/2013 1420   ALBUMIN 4.3 09/20/2013 1420   AST 14 09/20/2013 1420   ALT 11 09/20/2013 1420   ALKPHOS 102 09/20/2013 1420   BILITOT 0.5 09/20/2013 1420   GFRNONAA >90 09/20/2013 2203   GFRAA >90 09/20/2013 2203     Recent Labs Lab 09/20/13 1420  LIPASE 21   No results found for this basename: AMMONIA,  in the last 168 hours  No results found for this basename: CKTOTAL, CKMB, CKMBINDEX, TROPONINI,  in the last 168 hours BNP (last 3 results) No results found for this basename: PROBNP,  in the last 8760 hours  Radiological Exams on Admission: Dg Abd Acute W/chest  09/20/2013   CLINICAL DATA:  Chest pain. Hematemesis. Question Mallory-Weiss tear.  EXAM: ACUTE ABDOMEN SERIES (ABDOMEN 2 VIEW & CHEST 1 VIEW)  COMPARISON:  None available for direct review. Report of 05/20/2011 exam is available.  FINDINGS: Clear lungs.  Heart size top-normal.  No findings of pneumomediastinum.  Nonspecific bowel gas pattern without plain film evidence of bowel obstruction or free intraperitoneal air.  Secretion and gas-filled stomach.  No osseous abnormality noted. Minimal curvature lumbar spine may be positional.  IMPRESSION: No acute abnormality.  Please see above.   Electronically Signed   By: Chauncey Cruel M.D.   On: 09/20/2013 17:11    EKG: Independently reviewed. normal EKG, normal sinus rhythm.  Assessment/Plan Principal Problem:   Hypokalemia Active Problems:   Intractable nausea and vomiting   Abdominal pain, unspecified site   1. Hypokalemia The patient is presenting with complaints of recurrent nausea and  vomiting which are not controlled with home Zofran and Phenergan. She also has hypokalemia with normal magnesium level. She will be admitted for observation on telemetry, IV potassium, IV Ringer lactate with potassium, I will check potassium level in midnight. Replace as needed.  2. Abdominal pain No significant abdominal abnormality on exam other than mild tenderness, lab work appears within normal limits lipase liver negative continue to monitor IV Protonix twice a day keep the patient n.p.o. IV hydration  3. Intractable nausea and vomiting Likely viral gastroenteritis IV Zofran IV Protonix  DVT Prophylaxis: subcutaneous Heparin Nutrition: N.p.o.  Code Status: Full  Disposition: Admitted to observationin telemetry unit.  Author: Berle Mull, MD [facility] Pager: [phone]   If 7PM-7AM, please contact night-coverage [email or web address] Password [email or web address]

## 2013-09-22 MED ORDER — INFLUENZA VAC SPLIT QUAD 0.5 ML IM SUSP
0.5000 mL | INTRAMUSCULAR | Status: AC
  Administered 2013-09-23: 0.5 mL via INTRAMUSCULAR
  Filled 2013-09-22 (×2): qty 0.5

## 2013-09-22 NOTE — Progress Notes (Signed)
[facility] PROGRESS NOTE  Amy Heath WNI:[id number] DOB: [birth year] DOA: 09/20/2013 PCP: No PCP Per Patient  Assessment/Plan: 1. Hypokalemia  Resolved after repletion - Most likely 2ary to nausea and emesis.  2. Abdominal pain  No significant abdominal abnormality on exam Liver enzymes within normal limits, Lipase WNL IV Protonix twice a day - continue supportive therapy.   3. Intractable nausea and vomiting  Likely viral gastroenteritis  IV Zofran IV Protonix   DVT Prophylaxis: subcutaneous Heparin  Nutrition: N.p.o.  Code Status: full Family Communication: No family at bedside Disposition Plan: Pending resolution of nausea/emesis and improvement in oral intake.   Consultants:  None  Procedures:  None  Antibiotics:  None  HPI/Subjective: Pt still complaining of poor oral intake. No acute issues reported overnight.  Objective: Filed Vitals:   09/22/13 1420  BP: 105/59  Pulse: 71  Temp: 98.5 F (36.9 C)  Resp: 19    Intake/Output Summary (Last 24 hours) at 09/22/13 1519 Last data filed at 09/22/13 3818  Gross per 24 hour  Intake    340 ml  Output      0 ml  Net    340 ml   Filed Weights   09/20/13 1937  Weight: 81.421 kg (179 lb 8 oz)    Exam:   General:  Pt in nAD, alert and awake  Cardiovascular: RRR, no MRG  Respiratory: CTA BL, no wheezes  Abdomen: soft, ND, + bowel sounds  Musculoskeletal: no cyanosis or clubbing   Data Reviewed: Basic Metabolic Panel:  Recent Labs Lab 09/18/13 1820 09/20/13 1420 09/20/13 2203 09/21/13 1158  NA 142 139 139 140  K 3.7 2.9* 3.6* 4.0  CL 104 96 100 102  CO2 18* 24 23 25   GLUCOSE 271* 126* 107* 75  BUN 10 13 10 10   CREATININE 0.59 0.69 0.76 0.87  CALCIUM 10.1 9.9 9.2 9.0  MG  --  1.9  --   --    Liver Function Tests:  Recent Labs Lab 09/18/13 1820 09/20/13 1420  AST 14 14  ALT 13 11  ALKPHOS 105 102  BILITOT 0.3 0.5  PROT 8.2 8.8*  ALBUMIN 4.1 4.3    Recent  Labs Lab 09/20/13 1420  LIPASE 21   No results found for this basename: AMMONIA,  in the last 168 hours CBC:  Recent Labs Lab 09/18/13 1820 09/20/13 1420 09/21/13 1158  WBC 9.9 11.7* 8.0  NEUTROABS  --  7.6  --   HGB 11.6* 12.2 10.3*  HCT 34.4* 35.0* 30.5*  MCV 79.4 77.4* 79.6  PLT 316 410* 326   Cardiac Enzymes: No results found for this basename: CKTOTAL, CKMB, CKMBINDEX, TROPONINI,  in the last 168 hours BNP (last 3 results) No results found for this basename: PROBNP,  in the last 8760 hours CBG: No results found for this basename: GLUCAP,  in the last 168 hours  No results found for this or any previous visit (from the past 240 hour(s)).   Studies: Dg Abd Acute W/chest  09/20/2013   CLINICAL DATA:  Chest pain. Hematemesis. Question Mallory-Weiss tear.  EXAM: ACUTE ABDOMEN SERIES (ABDOMEN 2 VIEW & CHEST 1 VIEW)  COMPARISON:  None available for direct review. Report of 05/20/2011 exam is available.  FINDINGS: Clear lungs.  Heart size top-normal.  No findings of pneumomediastinum.  Nonspecific bowel gas pattern without plain film evidence of bowel obstruction or free intraperitoneal air.  Secretion and gas-filled stomach.  No osseous abnormality noted. Minimal curvature lumbar spine may be  positional.  IMPRESSION: No acute abnormality.  Please see above.   Electronically Signed   By: Chauncey Cruel M.D.   On: 09/20/2013 17:11    Scheduled Meds: . heparin  5,000 Units Subcutaneous 3 times per day  . [START ON 09/23/2013] influenza vac split quadrivalent PF  0.5 mL Intramuscular Tomorrow-1000  . pantoprazole (PROTONIX) IV  40 mg Intravenous Q12H  . sodium chloride  3 mL Intravenous Q12H   Continuous Infusions:    Principal Problem:   Hypokalemia Active Problems:   Intractable nausea and vomiting   Abdominal pain, unspecified site    Time spent: > 35 minutes    Velvet Bathe  [facility] Pager [phone]. If 7PM-7AM, please contact night-coverage at  [email or web address], password [facility] 09/22/2013, 3:19 PM  LOS: 2 days

## 2013-09-23 DIAGNOSIS — A084 Viral intestinal infection, unspecified: Secondary | ICD-10-CM | POA: Diagnosis present

## 2013-09-23 MED ORDER — KCL IN DEXTROSE-NACL 20-5-0.45 MEQ/L-%-% IV SOLN
INTRAVENOUS | Status: DC
  Administered 2013-09-23 – 2013-09-24 (×3): via INTRAVENOUS
  Filled 2013-09-23 (×5): qty 1000

## 2013-09-23 NOTE — Progress Notes (Signed)
[facility] PROGRESS NOTE  Coryn Mosso WIO:[id number] DOB: [birth year] DOA: 09/20/2013 PCP: No PCP Per Patient  Assessment/Plan: 1. Hypokalemia  Resolved after repletion - Most likely 2ary to nausea and emesis.  2. Abdominal pain  No significant abdominal abnormality on exam Liver enzymes within normal limits, Lipase WNL IV Protonix twice a day - continue supportive therapy.   3. Intractable nausea and vomiting  Likely viral gastroenteritis  IV Zofran IV Protonix  - Patient will saline lock but patient's oral intake has been poor. We'll place on maintenance IV fluids.  DVT Prophylaxis: subcutaneous Heparin  Nutrition: N.p.o.  Code Status: full Family Communication: No family at bedside Disposition Plan: Pending resolution of nausea/emesis and improvement in oral intake.   Consultants:  None  Procedures:  None  Antibiotics:  None  HPI/Subjective: Pt still complaining of poor oral intake. No acute issues reported overnight.  Objective: Filed Vitals:   09/23/13 1425  BP:   Pulse:   Temp: 98.5 F (36.9 C)  Resp:    No intake or output data in the 24 hours ending 09/23/13 1502 Filed Weights   09/20/13 1937  Weight: 81.421 kg (179 lb 8 oz)    Exam:   General:  Pt in nAD, alert and awake  Cardiovascular: RRR, no MRG  Respiratory: CTA BL, no wheezes  Abdomen: soft, ND, + bowel sounds  Musculoskeletal: no cyanosis or clubbing   Data Reviewed: Basic Metabolic Panel:  Recent Labs Lab 09/18/13 1820 09/20/13 1420 09/20/13 2203 09/21/13 1158  NA 142 139 139 140  K 3.7 2.9* 3.6* 4.0  CL 104 96 100 102  CO2 18* 24 23 25   GLUCOSE 271* 126* 107* 75  BUN 10 13 10 10   CREATININE 0.59 0.69 0.76 0.87  CALCIUM 10.1 9.9 9.2 9.0  MG  --  1.9  --   --    Liver Function Tests:  Recent Labs Lab 09/18/13 1820 09/20/13 1420  AST 14 14  ALT 13 11  ALKPHOS 105 102  BILITOT 0.3 0.5  PROT 8.2 8.8*  ALBUMIN 4.1 4.3    Recent Labs Lab  09/20/13 1420  LIPASE 21   No results found for this basename: AMMONIA,  in the last 168 hours CBC:  Recent Labs Lab 09/18/13 1820 09/20/13 1420 09/21/13 1158  WBC 9.9 11.7* 8.0  NEUTROABS  --  7.6  --   HGB 11.6* 12.2 10.3*  HCT 34.4* 35.0* 30.5*  MCV 79.4 77.4* 79.6  PLT 316 410* 326   Cardiac Enzymes: No results found for this basename: CKTOTAL, CKMB, CKMBINDEX, TROPONINI,  in the last 168 hours BNP (last 3 results) No results found for this basename: PROBNP,  in the last 8760 hours CBG: No results found for this basename: GLUCAP,  in the last 168 hours  No results found for this or any previous visit (from the past 240 hour(s)).   Studies: No results found.  Scheduled Meds: . heparin  5,000 Units Subcutaneous 3 times per day  . pantoprazole (PROTONIX) IV  40 mg Intravenous Q12H  . sodium chloride  3 mL Intravenous Q12H   Continuous Infusions:    Principal Problem:   Hypokalemia Active Problems:   Intractable nausea and vomiting   Abdominal pain, unspecified site   Viral gastroenteritis    Time spent: > 35 minutes    Velvet Bathe  [facility] Pager [phone]. If 7PM-7AM, please contact night-coverage at [email or web address], password [facility] 09/23/2013, 3:02 PM  LOS: 3 days

## 2013-09-24 MED ORDER — ONDANSETRON 4 MG PO TBDP: 4.0000 mg | ORAL_TABLET | Freq: Three times a day (TID) | ORAL | Status: DC | PRN

## 2013-09-24 MED ORDER — ENSURE PUDDING PO PUDG
1.0000 | Freq: Three times a day (TID) | ORAL | Status: DC
  Administered 2013-09-24: 1 via ORAL
  Filled 2013-09-24 (×3): qty 1

## 2013-09-24 MED ORDER — ACETAMINOPHEN 500 MG PO TABS: 500.0000 mg | ORAL_TABLET | Freq: Four times a day (QID) | ORAL | Status: DC | PRN

## 2013-09-24 MED ORDER — ENSURE PUDDING PO PUDG: 1.0000 | Freq: Three times a day (TID) | ORAL | Status: DC

## 2013-09-24 NOTE — Progress Notes (Signed)
Patient discharged home with family, discharge instructions given and explained to patient and she verbalized understanding. Denies any distress; no wound noted, skin intact. Accompanied home by family.

## 2013-09-24 NOTE — Discharge Summary (Signed)
Physician Discharge Summary  Amy Heath XBM:[id number] DOB: 1981-06-19 DOA: 09/20/2013  PCP: No PCP Per Patient  Admit date: 09/20/2013 Discharge date: 09/24/2013  Time spent: > 35 minutes  Recommendations for Outpatient Follow-up:  1. Patient is to continue good hand hygiene at home. 2. Will need to follow up with her primary care physician in 1-3 days  Discharge Diagnoses:  Principal Problem:   Hypokalemia Active Problems:   Intractable nausea and vomiting   Abdominal pain, unspecified site   Viral gastroenteritis   Discharge Condition: stable  Diet recommendation: As tolerated  Filed Weights   09/20/13 1937  Weight: 81.421 kg (179 lb 8 oz)    History of present illness:  The patient is a 33 year old with history of ovarian cyst and sickle cell trait. She presented initially with abdominal pain nausea and vomiting.  Hospital Course:   1. Hypokalemia  Resolved after repletion  - Most likely 2ary to nausea and emesis.   2. Abdominal pain  No significant abdominal abnormality on exam  Liver enzymes within normal limits, Lipase WNL  Patient reports improved oral intake on days of discharge We'll discharge with ensure supplements Also with Zofran to take as needed for nausea   3. Intractable nausea and vomiting  Likely viral gastroenteritis  We'll plan on discharging on Zofran and patient to take as needed   Procedures:  None  Consultations:  None  Discharge Exam: Filed Vitals:   09/24/13 0608  BP: 97/49  Pulse: 71  Temp: 98.7 F (37.1 C)  Resp: 16    General: Pt in NAD, alert and awake Cardiovascular: RRR, no MRG Respiratory: CTA BL, no wheezes Abdomen: soft, NT, ND  Discharge Instructions  Discharge Orders   Future Appointments Provider Department Dept Phone   09/27/2013 9:30 AM [facility] Covering Provider [employer] [phone]   Future Orders Complete By Expires   Call MD for:  persistant nausea and vomiting   As directed    Call MD for:  severe uncontrolled pain  As directed    Call MD for:  temperature >100.4  As directed    Diet - low sodium heart healthy  As directed    Increase activity slowly  As directed        Medication List         acetaminophen 500 MG tablet  Commonly known as:  TYLENOL  Take 1 tablet (500 mg total) by mouth every 6 (six) hours as needed for mild pain.     diphenhydrAMINE 25 MG tablet  Commonly known as:  BENADRYL  Take 25 mg by mouth every 6 (six) hours as needed for itching.     feeding supplement (ENSURE) Pudg  Take 1 Container by mouth 3 (three) times daily between meals.     ondansetron 4 MG disintegrating tablet  Commonly known as:  ZOFRAN ODT  Take 1 tablet (4 mg total) by mouth every 8 (eight) hours as needed for nausea or vomiting.     promethazine 25 MG tablet  Commonly known as:  PHENERGAN  Take 1 tablet (25 mg total) by mouth every 6 (six) hours as needed for nausea or vomiting.       No Known Allergies     Follow-up Information   Follow up with [facility]     On 09/27/2013. (@9 :30a/Dr. Antony Madura visit/photo id/medicine in bottle)    Contact information:   [address] [place] [address] [phone]  The results of significant diagnostics from this hospitalization (including imaging, microbiology, ancillary and laboratory) are listed below for reference.    Significant Diagnostic Studies: Dg Abd Acute W/chest  09/20/2013   CLINICAL DATA:  Chest pain. Hematemesis. Question Mallory-Weiss tear.  EXAM: ACUTE ABDOMEN SERIES (ABDOMEN 2 VIEW & CHEST 1 VIEW)  COMPARISON:  None available for direct review. Report of 05/20/2011 exam is available.  FINDINGS: Clear lungs.  Heart size top-normal.  No findings of pneumomediastinum.  Nonspecific bowel gas pattern without plain film evidence of bowel obstruction or free intraperitoneal air.  Secretion and gas-filled stomach.  No osseous abnormality  noted. Minimal curvature lumbar spine may be positional.  IMPRESSION: No acute abnormality.  Please see above.   Electronically Signed   By: Chauncey Cruel M.D.   On: 09/20/2013 17:11    Microbiology: No results found for this or any previous visit (from the past 240 hour(s)).   Labs: Basic Metabolic Panel:  Recent Labs Lab 09/18/13 1820 09/20/13 1420 09/20/13 2203 09/21/13 1158  NA 142 139 139 140  K 3.7 2.9* 3.6* 4.0  CL 104 96 100 102  CO2 18* 24 23 25   GLUCOSE 271* 126* 107* 75  BUN 10 13 10 10   CREATININE 0.59 0.69 0.76 0.87  CALCIUM 10.1 9.9 9.2 9.0  MG  --  1.9  --   --    Liver Function Tests:  Recent Labs Lab 09/18/13 1820 09/20/13 1420  AST 14 14  ALT 13 11  ALKPHOS 105 102  BILITOT 0.3 0.5  PROT 8.2 8.8*  ALBUMIN 4.1 4.3    Recent Labs Lab 09/20/13 1420  LIPASE 21   No results found for this basename: AMMONIA,  in the last 168 hours CBC:  Recent Labs Lab 09/18/13 1820 09/20/13 1420 09/21/13 1158  WBC 9.9 11.7* 8.0  NEUTROABS  --  7.6  --   HGB 11.6* 12.2 10.3*  HCT 34.4* 35.0* 30.5*  MCV 79.4 77.4* 79.6  PLT 316 410* 326   Cardiac Enzymes: No results found for this basename: CKTOTAL, CKMB, CKMBINDEX, TROPONINI,  in the last 168 hours BNP: BNP (last 3 results) No results found for this basename: PROBNP,  in the last 8760 hours CBG: No results found for this basename: GLUCAP,  in the last 168 hours     Signed:  Velvet Bathe  [facility] 09/24/2013, 10:19 AM

## 2013-09-27 ENCOUNTER — Inpatient Hospital Stay: Payer: Medicaid Other

## 2013-11-20 ENCOUNTER — Emergency Department (HOSPITAL_COMMUNITY)
Admission: EM | Admit: 2013-11-20 | Discharge: 2013-11-20 | Disposition: A | Discharge status: Home / Self Care | Payer: Medicaid Other | Attending: Emergency Medicine | Admitting: Emergency Medicine

## 2013-11-20 ENCOUNTER — Encounter (HOSPITAL_COMMUNITY): Payer: Self-pay | Admitting: Emergency Medicine

## 2013-11-20 DIAGNOSIS — J029 Acute pharyngitis, unspecified: Secondary | ICD-10-CM | POA: Insufficient documentation

## 2013-11-20 DIAGNOSIS — Z8742 Personal history of other diseases of the female genital tract: Secondary | ICD-10-CM | POA: Insufficient documentation

## 2013-11-20 DIAGNOSIS — H9209 Otalgia, unspecified ear: Secondary | ICD-10-CM | POA: Insufficient documentation

## 2013-11-20 DIAGNOSIS — Z862 Personal history of diseases of the blood and blood-forming organs and certain disorders involving the immune mechanism: Secondary | ICD-10-CM | POA: Insufficient documentation

## 2013-11-20 MED ORDER — AMOXICILLIN 500 MG PO CAPS: 500.0000 mg | ORAL_CAPSULE | Freq: Three times a day (TID) | ORAL | Status: DC

## 2013-11-20 NOTE — ED Notes (Addendum)
C/o of throat pain ,, neck swelling, and pain with swallowing for past three days. Productive cough, green mucus.  Describes pain 10/10, tried tylenol and alka-seltzer with minor relief, no acute distress noted.

## 2013-11-20 NOTE — ED Provider Notes (Signed)
CSN: [id number]     Arrival date & time 11/20/13  1327 History   First MD Initiated Contact with Patient 11/20/13 1608     Chief Complaint  Patient presents with  . Sore Throat     (Consider location/radiation/quality/duration/timing/severity/associated sxs/prior Treatment) Patient is a 33 y.o. female presenting with pharyngitis. The history is provided by the patient and medical records.  Sore Throat Associated symptoms include congestion, a fever and a sore throat.   This is a 33 year old female presenting to the ED for productive cough, subjective fever, sore throat, and bilateral ear pain for the past 7 days. She states her daughter is sick with similar symptoms.  She states she also feels head congestion which is giving her a "nagging headache" across her cheeks and forehead, unresponsive to extra strength tylenol.  States states she has some pain along her anterior neck and feels that "my glands are swollen."  She denies any chest pain, SOB, palpitations, dizziness, weakness, nausea, or vomiting.  VS stable on arrival.  Past Medical History  Diagnosis Date  . Ovarian cyst   . Sickle cell trait    Past Surgical History  Procedure Laterality Date  . Ovarian cyst surgery    . Laparoscopy N/A 10/11/2012    Procedure: LAPAROSCOPY OPERATIVE;  Surgeon: Guss Bunde, MD;  Location: [facility];  Service: Gynecology;  Laterality: N/A;  . Laparotomy N/A 10/11/2012    Procedure: EXPLORATORY LAPAROTOMY.  Removal of  Ectopic pregnancy.  ;  Surgeon: Guss Bunde, MD;  Location: [facility];  Service: Gynecology;  Laterality: N/A;  . Lysis of adhesion N/A 10/11/2012    Procedure: LYSIS OF ADHESION;  Surgeon: Guss Bunde, MD;  Location: [facility];  Service: Gynecology;  Laterality: N/A;  . Unilateral salpingectomy Right 10/11/2012    Procedure: UNILATERAL SALPINGECTOMY;  Surgeon: Guss Bunde, MD;  Location: [facility];  Service: Gynecology;  Laterality: Right;   Family History  Problem Relation Age  of Onset  . Alcohol abuse Neg Hx   . COPD Mother    History  Substance Use Topics  . Smoking status: Never Smoker   . Smokeless tobacco: Not on file  . Alcohol Use: No   OB History   Grav Para Term Preterm Abortions TAB SAB Ect Mult Living   7 6 6  1   1  6      Review of Systems  Constitutional: Positive for fever.  HENT: Positive for congestion, ear pain, sinus pressure and sore throat.   All other systems reviewed and are negative.     Allergies  Review of patient's allergies indicates no known allergies.  Home Medications   Prior to Admission medications   Not on File   BP 127/98  Pulse 111  Temp(Src) 98.9 F (37.2 C) (Oral)  Resp 22  Ht 5\' 6"  (1.676 m)  Wt 180 lb (81.647 kg)  BMI 29.07 kg/m2  SpO2 100%  Physical Exam  Nursing note and vitals reviewed. Constitutional: She is oriented to person, place, and time. She appears well-developed and well-nourished. No distress.  HENT:  Head: Normocephalic and atraumatic.  Right Ear: Tympanic membrane and ear canal normal.  Left Ear: Tympanic membrane and ear canal normal.  Nose: Mucosal edema present. Right sinus exhibits maxillary sinus tenderness. Right sinus exhibits no frontal sinus tenderness. Left sinus exhibits maxillary sinus tenderness. Left sinus exhibits no frontal sinus tenderness.  Mouth/Throat: Uvula is midline and mucous membranes are normal. Posterior oropharyngeal erythema present. No oropharyngeal  exudate, posterior oropharyngeal edema or tonsillar abscesses.  Posterior oropharynx erythematous with some PND noted; tonsils 1+ bilaterally without exudate; uvula midline without peritonsillar abscess; handling secretions appropriately; no difficulty swallowing or speaking  Eyes: Conjunctivae, EOM and lids are normal. Pupils are equal, round, and reactive to light.  Neck: Normal range of motion and full passive range of motion without pain. Neck supple. No muscular tenderness present. No rigidity.  No  meningeal signs  Cardiovascular: Normal rate, regular rhythm and normal heart sounds.   Pulmonary/Chest: Effort normal and breath sounds normal. No respiratory distress. She has no wheezes.  Musculoskeletal: Normal range of motion.  Lymphadenopathy:    She has cervical adenopathy.  Mildly enlarged anterior cervical lymph nodes, TTP  Neurological: She is alert and oriented to person, place, and time.  Skin: Skin is warm and dry. She is not diaphoretic.  Psychiatric: She has a normal mood and affect.    ED Course  Procedures (including critical care time) Labs Review Labs Reviewed - No data to display  Imaging Review No results found.   EKG Interpretation None      MDM   Final diagnoses:  Sore throat   Pt with signs/sx concerning for sinusitis vs step pharyngitis.  She is afebrile and overall non-toxic appearing without nuchal rigidity to suggest meninigitis.  Offered strep screening, she declined states she did not have time to wait and just preferred treatment.  She will be started on amoxicillin.  Continue tylenol/motrin PRN fever/headache. Discussed plan with patient, he/she acknowledged understanding and agreed with plan of care.  Return precautions given for new or worsening symptoms.  Larene Pickett, PA-C 11/20/13 [phone]

## 2013-11-20 NOTE — Discharge Instructions (Signed)
Take the prescribed medication as directed.  May take tylenol or motrin as needed for fever. May use over the counter chloraseptic spray and/or salt water gargles as needed for sore throat. Return to the ED for new or worsening symptoms.

## 2013-11-20 NOTE — ED Notes (Signed)
Pt reports cough, fever, sorethroat, and neck and ear pain, onset 3 days ago.

## 2013-11-23 NOTE — ED Provider Notes (Signed)
Medical screening examination/treatment/procedure(s) were performed by non-physician practitioner and as supervising physician I was immediately available for consultation/collaboration.  Dosha Broshears Y. Lelia Jons, MD 11/23/13 2252 

## 2014-05-02 ENCOUNTER — Encounter (HOSPITAL_COMMUNITY): Payer: Self-pay | Admitting: Emergency Medicine

## 2014-05-02 ENCOUNTER — Emergency Department (HOSPITAL_COMMUNITY)
Admission: EM | Admit: 2014-05-02 | Discharge: 2014-05-02 | Disposition: A | Discharge status: Home / Self Care | Payer: Medicaid Other | Attending: Emergency Medicine | Admitting: Emergency Medicine

## 2014-05-02 DIAGNOSIS — Z8742 Personal history of other diseases of the female genital tract: Secondary | ICD-10-CM | POA: Insufficient documentation

## 2014-05-02 DIAGNOSIS — Z862 Personal history of diseases of the blood and blood-forming organs and certain disorders involving the immune mechanism: Secondary | ICD-10-CM | POA: Diagnosis not present

## 2014-05-02 DIAGNOSIS — K088 Other specified disorders of teeth and supporting structures: Secondary | ICD-10-CM | POA: Insufficient documentation

## 2014-05-02 DIAGNOSIS — K029 Dental caries, unspecified: Secondary | ICD-10-CM | POA: Diagnosis not present

## 2014-05-02 DIAGNOSIS — Z792 Long term (current) use of antibiotics: Secondary | ICD-10-CM | POA: Insufficient documentation

## 2014-05-02 DIAGNOSIS — K0889 Other specified disorders of teeth and supporting structures: Secondary | ICD-10-CM

## 2014-05-02 DIAGNOSIS — K0381 Cracked tooth: Secondary | ICD-10-CM | POA: Insufficient documentation

## 2014-05-02 MED ORDER — TRAMADOL HCL 50 MG PO TABS: 50.0000 mg | ORAL_TABLET | Freq: Four times a day (QID) | ORAL | Status: DC | PRN

## 2014-05-02 MED ORDER — PENICILLIN V POTASSIUM 500 MG PO TABS: 500.0000 mg | ORAL_TABLET | Freq: Four times a day (QID) | ORAL | Status: AC

## 2014-05-02 NOTE — Progress Notes (Signed)
  CARE MANAGEMENT ED NOTE 05/02/2014  Patient:  [facility]   Account Number:  [id number]  Date Initiated:  05/02/2014  Documentation initiated by:  Jackelyn Poling  Subjective/Objective Assessment:   33 yr old [place] access [employer] pt acute, gradually worsening moderate lower dental pain onset two weeks ago     Subjective/Objective Assessment Detail:   [facility]  [address]  [id number]     Action/Plan:   EPIC updated   Action/Plan Detail:   Anticipated DC Date:  05/02/2014     Status Recommendation to Physician:   Result of Recommendation:    Other ED Services  Consult Working [place]  Other  Outpatient Services - Pt will follow up  PCP issues    Choice offered to / List presented to:            Status of service:  Completed, signed off  ED Comments:   ED Comments Detail:

## 2014-05-02 NOTE — Discharge Instructions (Signed)
Dental Pain Toothache is pain in or around a tooth. It may get worse with chewing or with cold or heat.  HOME CARE  Your dentist may use a numbing medicine during treatment. If so, you may need to avoid eating until the medicine wears off. Ask your dentist about this.  Only take medicine as told by your dentist or doctor.  Avoid chewing food near the painful tooth until after all treatment is done. Ask your dentist about this. GET HELP RIGHT AWAY IF:   The problem gets worse or new problems appear.  You have a fever.  There is redness and puffiness (swelling) of the face, jaw, or neck.  You cannot open your mouth.  There is pain in the jaw.  There is very bad pain that is not helped by medicine. MAKE SURE YOU:   Understand these instructions.  Will watch your condition.  Will get help right away if you are not doing well or get worse. Document Released: 12/23/2007 Document Revised: 09/28/2011 Document Reviewed: 12/23/2007 [facility] Patient Information 2015 [place], [place]. This information is not intended to replace advice given to you by your health care provider. Make sure you discuss any questions you have with your health care provider.  Dental Care and Dentist Visits Dental care supports good overall health. Regular dental visits can also help you avoid dental pain, bleeding, infection, and other more serious health problems in the future. It is important to keep the mouth healthy because diseases in the teeth, gums, and other oral tissues can spread to other areas of the body. Some problems, such as diabetes, heart disease, and pre-term labor have been associated with poor oral health.  See your dentist every 6 months. If you experience emergency problems such as a toothache or broken tooth, go to the dentist right away. If you see your dentist regularly, you may catch problems early. It is easier to be treated for problems in the early stages.  WHAT TO EXPECT AT A DENTIST  VISIT  Your dentist will look for many common oral health problems and recommend proper treatment. At your regular dental visit, you can expect:  Gentle cleaning of the teeth and gums. This includes scraping and polishing. This helps to remove the sticky substance around the teeth and gums (plaque). Plaque forms in the mouth shortly after eating. Over time, plaque hardens on the teeth as tartar. If tartar is not removed regularly, it can cause problems. Cleaning also helps remove stains.  Periodic X-rays. These pictures of the teeth and supporting bone will help your dentist assess the health of your teeth.  Periodic fluoride treatments. Fluoride is a natural mineral shown to help strengthen teeth. Fluoride treatmentinvolves applying a fluoride gel or varnish to the teeth. It is most commonly done in children.  Examination of the mouth, tongue, jaws, teeth, and gums to look for any oral health problems, such as:  Cavities (dental caries). This is decay on the tooth caused by plaque, sugar, and acid in the mouth. It is best to catch a cavity when it is small.  Inflammation of the gums caused by plaque buildup (gingivitis).  Problems with the mouth or malformed or misaligned teeth.  Oral cancer or other diseases of the soft tissues or jaws. KEEP YOUR TEETH AND GUMS HEALTHY For healthy teeth and gums, follow these general guidelines as well as your dentist's specific advice:  Have your teeth professionally cleaned at the dentist every 6 months.  Brush twice daily with a  fluoride toothpaste.  Floss your teeth daily.  Ask your dentist if you need fluoride supplements, treatments, or fluoride toothpaste.  Eat a healthy diet. Reduce foods and drinks with added sugar.  Avoid smoking. TREATMENT FOR ORAL HEALTH PROBLEMS If you have oral health problems, treatment varies depending on the conditions present in your teeth and gums.  Your caregiver will most likely recommend good oral hygiene  at each visit.  For cavities, gingivitis, or other oral health disease, your caregiver will perform a procedure to treat the problem. This is typically done at a separate appointment. Sometimes your caregiver will refer you to another dental specialist for specific tooth problems or for surgery. SEEK IMMEDIATE DENTAL CARE IF:  You have pain, bleeding, or soreness in the gum, tooth, jaw, or mouth area.  A permanent tooth becomes loose or separated from the gum socket.  You experience a blow or injury to the mouth or jaw area. Document Released: 03/18/2011 Document Revised: 09/28/2011 Document Reviewed: 03/18/2011 [facility] Patient Information 2015 [place], [place]. This information is not intended to replace advice given to you by your health care provider. Make sure you discuss any questions you have with your health care provider.  DENTAL RESOURCE GUIDE  Patients with Medicaid: [place], [place] [address] [phone]  If unable to pay, or uninsured, contact [facility] [phone]) or [place] [phone] in [place], [place] in [facility]) to become qualified for the adult dental clinic  Other [place]- [place], [place], [place], [address]    [phone], Ext. [phone]    2nd and 4th Thursday of the month at 6:30am    10 clients each day by appointment, can sometimes see walk-in patients if someone does not show for an appointment [place], [place], [place], [address]    [phone] [facility]- [address], [place], [place], [address]    [phone]  [place] Department- [phone] [place] [facility] Department- [phone]

## 2014-05-02 NOTE — ED Provider Notes (Signed)
CSN: [id number]     Arrival date & time 05/02/14  1314 History  This chart was scribed for a non-physician practitioner, Noland Fordyce, PA-C, working with Dot Lanes, MD by Cathie Hoops, ED Scribe. The patient was seen in [facility]. The patient's care was started at 1:43 PM.   Chief Complaint  Patient presents with  . Dental Pain   (Consider location/radiation/quality/duration/timing/severity/associated sxs/prior Treatment) The history is provided by the patient. No language interpreter was used.   HPI Comments: Amy Heath is a 33 y.o. female who presents to the Emergency Department complaining of acute, gradually worsening moderate lower dental pain onset two weeks ago. Pt has associated headache. Pt notes she was chewing on gum and her tooth broke off. Pt states she has a hole in the tooth. Pt denies having a dentist at this time. Pt denies any other symptoms at this time including no fever, n/v/d, or difficulty breathing or swallowing.  Past Medical History  Diagnosis Date  . Ovarian cyst   . Sickle cell trait    Past Surgical History  Procedure Laterality Date  . Ovarian cyst surgery    . Laparoscopy N/A 10/11/2012    Procedure: LAPAROSCOPY OPERATIVE;  Surgeon: Guss Bunde, MD;  Location: [facility];  Service: Gynecology;  Laterality: N/A;  . Laparotomy N/A 10/11/2012    Procedure: EXPLORATORY LAPAROTOMY.  Removal of  Ectopic pregnancy.  ;  Surgeon: Guss Bunde, MD;  Location: [facility];  Service: Gynecology;  Laterality: N/A;  . Lysis of adhesion N/A 10/11/2012    Procedure: LYSIS OF ADHESION;  Surgeon: Guss Bunde, MD;  Location: [facility];  Service: Gynecology;  Laterality: N/A;  . Unilateral salpingectomy Right 10/11/2012    Procedure: UNILATERAL SALPINGECTOMY;  Surgeon: Guss Bunde, MD;  Location: [facility];  Service: Gynecology;  Laterality: Right;   Family History  Problem Relation Age of Onset  . Alcohol abuse Neg Hx   . COPD Mother    History  Substance Use  Topics  . Smoking status: Never Smoker   . Smokeless tobacco: Not on file  . Alcohol Use: No   OB History   Grav Para Term Preterm Abortions TAB SAB Ect Mult Living   7 6 6  1   1  6      Review of Systems  Constitutional: Negative for fever and chills.  HENT: Positive for dental problem.   Respiratory: Negative for shortness of breath.   Gastrointestinal: Negative for nausea and vomiting.  Neurological: Negative for weakness.   Allergies  Review of patient's allergies indicates no known allergies.  Home Medications   Prior to Admission medications   Medication Sig Start Date End Date Taking? Authorizing Provider  amoxicillin (AMOXIL) 500 MG capsule Take 1 capsule (500 mg total) by mouth 3 (three) times daily. 11/20/13   Larene Pickett, PA-C  penicillin v potassium (VEETID) 500 MG tablet Take 1 tablet (500 mg total) by mouth 4 (four) times daily. 05/02/14 05/09/14  Noland Fordyce, PA-C  traMADol (ULTRAM) 50 MG tablet Take 1 tablet (50 mg total) by mouth every 6 (six) hours as needed. 05/02/14   Noland Fordyce, PA-C   Triage Vitals: BP 149/77  Pulse 82  Temp(Src) 98.7 F (37.1 C) (Oral)  Resp 16  SpO2 100%  LMP 04/04/2014  Physical Exam  Nursing note and vitals reviewed. Constitutional: She is oriented to person, place, and time. She appears well-developed and well-nourished.  HENT:  Head: Normocephalic and atraumatic.  Mouth/Throat: Uvula  is midline, oropharynx is clear and moist and mucous membranes are normal. No trismus in the jaw. Abnormal dentition. Dental caries present. No dental abscesses.    Right back lower last molar-cracked, dental carry present. No erythema, discharge, or gingival abscess. No airway obstruction.  Eyes: EOM are normal.  Neck: Normal range of motion.  Cardiovascular: Normal rate.   Pulmonary/Chest: Effort normal.  Musculoskeletal: Normal range of motion.  Neurological: She is alert and oriented to person, place, and time.  Skin: Skin is warm  and dry.  Psychiatric: She has a normal mood and affect. Her behavior is normal.    ED Course  Procedures (including critical care time) DIAGNOSTIC STUDIES: Oxygen Saturation is 100% on RA, normal by my interpretation.    COORDINATION OF CARE: 1:45 PM- Patient informed of current plan for treatment and evaluation and agrees with plan at this time.  MDM   Final diagnoses:  Pain, dental  Cracked tooth    Pt presenting to ED with c/o dental pain and cracked tooth. No gingival abscess present. Pt is afebrile, no respiratory distress. Rx: PCN and tramadol. Advised to f/u with Dr. Radford Pax, DDS, for further evaluation and treatment of dental pain. Home care instructions provided. Return precautions provided. Pt verbalized understanding and agreement with tx plan.   I personally performed the services described in this documentation, which was scribed in my presence. The recorded information has been reviewed and is accurate.    Noland Fordyce, PA-C 05/02/14 1429

## 2014-05-02 NOTE — ED Notes (Signed)
Pt reports broken tooth right lower side with pain 10/10.

## 2014-05-10 NOTE — ED Provider Notes (Signed)
Medical screening examination/treatment/procedure(s) were performed by non-physician practitioner and as supervising physician I was immediately available for consultation/collaboration.   Dot Lanes, MD 05/10/14 2116

## 2014-05-21 ENCOUNTER — Encounter (HOSPITAL_COMMUNITY): Payer: Self-pay | Admitting: Emergency Medicine

## 2014-09-21 ENCOUNTER — Encounter (HOSPITAL_COMMUNITY): Payer: Self-pay | Admitting: Emergency Medicine

## 2014-09-21 ENCOUNTER — Emergency Department (HOSPITAL_COMMUNITY)
Admission: EM | Admit: 2014-09-21 | Discharge: 2014-09-21 | Disposition: A | Discharge status: Home / Self Care | Payer: Medicaid Other | Attending: Emergency Medicine | Admitting: Emergency Medicine

## 2014-09-21 DIAGNOSIS — Z3202 Encounter for pregnancy test, result negative: Secondary | ICD-10-CM | POA: Diagnosis not present

## 2014-09-21 DIAGNOSIS — M545 Low back pain, unspecified: Secondary | ICD-10-CM

## 2014-09-21 DIAGNOSIS — M549 Dorsalgia, unspecified: Secondary | ICD-10-CM | POA: Diagnosis present

## 2014-09-21 DIAGNOSIS — Z8701 Personal history of pneumonia (recurrent): Secondary | ICD-10-CM | POA: Diagnosis not present

## 2014-09-21 DIAGNOSIS — Z792 Long term (current) use of antibiotics: Secondary | ICD-10-CM | POA: Insufficient documentation

## 2014-09-21 DIAGNOSIS — R102 Pelvic and perineal pain: Secondary | ICD-10-CM | POA: Diagnosis not present

## 2014-09-21 DIAGNOSIS — Z862 Personal history of diseases of the blood and blood-forming organs and certain disorders involving the immune mechanism: Secondary | ICD-10-CM | POA: Diagnosis not present

## 2014-09-21 DIAGNOSIS — Z8742 Personal history of other diseases of the female genital tract: Secondary | ICD-10-CM | POA: Diagnosis not present

## 2014-09-21 HISTORY — DX: Pneumonia, unspecified organism: J18.9

## 2014-09-21 LAB — WET PREP, GENITAL
Trich, Wet Prep: NONE SEEN
Yeast Wet Prep HPF POC: NONE SEEN

## 2014-09-21 LAB — URINALYSIS, ROUTINE W REFLEX MICROSCOPIC
BILIRUBIN URINE: NEGATIVE
GLUCOSE, UA: NEGATIVE mg/dL
HGB URINE DIPSTICK: NEGATIVE
Ketones, ur: NEGATIVE mg/dL
Leukocytes, UA: NEGATIVE
Nitrite: NEGATIVE
PH: 8 (ref 5.0–8.0)
PROTEIN: NEGATIVE mg/dL
SPECIFIC GRAVITY, URINE: 1.014 (ref 1.005–1.030)
Urobilinogen, UA: 1 mg/dL (ref 0.0–1.0)

## 2014-09-21 LAB — POC URINE PREG, ED: Preg Test, Ur: NEGATIVE

## 2014-09-21 MED ORDER — AZITHROMYCIN 1 G PO PACK
1.0000 g | PACK | Freq: Once | ORAL | Status: AC
  Administered 2014-09-21: 1 g via ORAL
  Filled 2014-09-21: qty 1

## 2014-09-21 MED ORDER — IBUPROFEN 800 MG PO TABS: 800.0000 mg | ORAL_TABLET | Freq: Three times a day (TID) | ORAL | Status: DC

## 2014-09-21 MED ORDER — ORPHENADRINE CITRATE ER 100 MG PO TB12: 100.0000 mg | ORAL_TABLET | Freq: Two times a day (BID) | ORAL | Status: DC

## 2014-09-21 MED ORDER — IBUPROFEN 200 MG PO TABS
600.0000 mg | ORAL_TABLET | Freq: Once | ORAL | Status: AC
  Administered 2014-09-21: 600 mg via ORAL
  Filled 2014-09-21: qty 3

## 2014-09-21 MED ORDER — LIDOCAINE HCL (PF) 1 % IJ SOLN
INTRAMUSCULAR | Status: AC
  Administered 2014-09-21: 1 mL
  Filled 2014-09-21: qty 5

## 2014-09-21 MED ORDER — CEFTRIAXONE SODIUM 250 MG IJ SOLR
250.0000 mg | Freq: Once | INTRAMUSCULAR | Status: AC
  Administered 2014-09-21: 250 mg via INTRAMUSCULAR
  Filled 2014-09-21 (×2): qty 250

## 2014-09-21 NOTE — ED Notes (Signed)
Pt started having back pain two days ago. Pain wraps around back to front abd.

## 2014-09-21 NOTE — Discharge Instructions (Signed)
Abdominal Pain, Women °Abdominal (stomach, pelvic, or belly) pain can be caused by many things. It is important to tell your doctor: °· The location of the pain. °· Does it come and go or is it present all the time? °· Are there things that start the pain (eating certain foods, exercise)? °· Are there other symptoms associated with the pain (fever, nausea, vomiting, diarrhea)? °All of this is helpful to know when trying to find the cause of the pain. °CAUSES  °· Stomach: virus or bacteria infection, or ulcer. °· Intestine: appendicitis (inflamed appendix), regional ileitis (Crohn's disease), ulcerative colitis (inflamed colon), irritable bowel syndrome, diverticulitis (inflamed diverticulum of the colon), or cancer of the stomach or intestine. °· Gallbladder disease or stones in the gallbladder. °· Kidney disease, kidney stones, or infection. °· Pancreas infection or cancer. °· Fibromyalgia (pain disorder). °· Diseases of the female organs: °¨ Uterus: fibroid (non-cancerous) tumors or infection. °¨ Fallopian tubes: infection or tubal pregnancy. °¨ Ovary: cysts or tumors. °¨ Pelvic adhesions (scar tissue). °¨ Endometriosis (uterus lining tissue growing in the pelvis and on the pelvic organs). °¨ Pelvic congestion syndrome (female organs filling up with blood just before the menstrual period). °¨ Pain with the menstrual period. °¨ Pain with ovulation (producing an egg). °¨ Pain with an IUD (intrauterine device, birth control) in the uterus. °¨ Cancer of the female organs. °· Functional pain (pain not caused by a disease, may improve without treatment). °· Psychological pain. °· Depression. °DIAGNOSIS  °Your doctor will decide the seriousness of your pain by doing an examination. °· Blood tests. °· X-rays. °· Ultrasound. °· CT scan (computed tomography, special type of X-ray). °· MRI (magnetic resonance imaging). °· Cultures, for infection. °· Barium enema (dye inserted in the large intestine, to better view it with  X-rays). °· Colonoscopy (looking in intestine with a lighted tube). °· Laparoscopy (minor surgery, looking in abdomen with a lighted tube). °· Major abdominal exploratory surgery (looking in abdomen with a large incision). °TREATMENT  °The treatment will depend on the cause of the pain.  °· Many cases can be observed and treated at home. °· Over-the-counter medicines recommended by your caregiver. °· Prescription medicine. °· Antibiotics, for infection. °· Birth control pills, for painful periods or for ovulation pain. °· Hormone treatment, for endometriosis. °· Nerve blocking injections. °· Physical therapy. °· Antidepressants. °· Counseling with a psychologist or psychiatrist. °· Minor or major surgery. °HOME CARE INSTRUCTIONS  °· Do not take laxatives, unless directed by your caregiver. °· Take over-the-counter pain medicine only if ordered by your caregiver. Do not take aspirin because it can cause an upset stomach or bleeding. °· Try a clear liquid diet (broth or water) as ordered by your caregiver. Slowly move to a bland diet, as tolerated, if the pain is related to the stomach or intestine. °· Have a thermometer and take your temperature several times a day, and record it. °· Bed rest and sleep, if it helps the pain. °· Avoid sexual intercourse, if it causes pain. °· Avoid stressful situations. °· Keep your follow-up appointments and tests, as your caregiver orders. °· If the pain does not go away with medicine or surgery, you may try: °¨ Acupuncture. °¨ Relaxation exercises (yoga, meditation). °¨ Group therapy. °¨ Counseling. °SEEK MEDICAL CARE IF:  °· You notice certain foods cause stomach pain. °· Your home care treatment is not helping your pain. °· You need stronger pain medicine. °· You want your IUD removed. °· You feel faint or   lightheaded.  You develop nausea and vomiting.  You develop a rash.  You are having side effects or an allergy to your medicine. SEEK IMMEDIATE MEDICAL CARE IF:   Your  pain does not go away or gets worse.  You have a fever.  Your pain is felt only in portions of the abdomen. The right side could possibly be appendicitis. The left lower portion of the abdomen could be colitis or diverticulitis.  You are passing blood in your stools (bright red or black tarry stools, with or without vomiting).  You have blood in your urine.  You develop chills, with or without a fever.  You pass out. MAKE SURE YOU:   Understand these instructions.  Will watch your condition.  Will get help right away if you are not doing well or get worse. Document Released: 05/03/2007 Document Revised: 11/20/2013 Document Reviewed: 05/23/2009 [facility] Patient Information 2015 [place], [place]. This information is not intended to replace advice given to you by your health care provider. Make sure you discuss any questions you have with your health care provider. Back Pain, Adult Low back pain is very common. About 1 in 5 people have back pain.The cause of low back pain is rarely dangerous. The pain often gets better over time.About half of people with a sudden onset of back pain feel better in just 2 weeks. About 8 in 10 people feel better by 6 weeks.  CAUSES Some common causes of back pain include:  Strain of the muscles or ligaments supporting the spine.  Wear and tear (degeneration) of the spinal discs.  Arthritis.  Direct injury to the back. DIAGNOSIS Most of the time, the direct cause of low back pain is not known.However, back pain can be treated effectively even when the exact cause of the pain is unknown.Answering your caregiver's questions about your overall health and symptoms is one of the most accurate ways to make sure the cause of your pain is not dangerous. If your caregiver needs more information, he or she may order lab work or imaging tests (X-rays or MRIs).However, even if imaging tests show changes in your back, this usually does not require surgery. HOME  CARE INSTRUCTIONS For many people, back pain returns.Since low back pain is rarely dangerous, it is often a condition that people can learn to [facility] their own.   Remain active. It is stressful on the back to sit or stand in one place. Do not sit, drive, or stand in one place for more than 30 minutes at a time. Take short walks on level surfaces as soon as pain allows.Try to increase the length of time you walk each day.  Do not stay in bed.Resting more than 1 or 2 days can delay your recovery.  Do not avoid exercise or work.Your body is made to move.It is not dangerous to be active, even though your back may hurt.Your back will likely heal faster if you return to being active before your pain is gone.  Pay attention to your body when you bend and lift. Many people have less discomfortwhen lifting if they bend their knees, keep the load close to their bodies,and avoid twisting. Often, the most comfortable positions are those that put less stress on your recovering back.  Find a comfortable position to sleep. Use a firm mattress and lie on your side with your knees slightly bent. If you lie on your back, put a pillow under your knees.  Only take over-the-counter or prescription medicines as directed  by your caregiver. Over-the-counter medicines to reduce pain and inflammation are often the most helpful.Your caregiver may prescribe muscle relaxant drugs.These medicines help dull your pain so you can more quickly return to your normal activities and healthy exercise.  Put ice on the injured area.  Put ice in a plastic bag.  Place a towel between your skin and the bag.  Leave the ice on for 15-20 minutes, 03-04 times a day for the first 2 to 3 days. After that, ice and heat may be alternated to reduce pain and spasms.  Ask your caregiver about trying back exercises and gentle massage. This may be of some benefit.  Avoid feeling anxious or stressed.Stress increases muscle tension  and can worsen back pain.It is important to recognize when you are anxious or stressed and learn ways to manage it.Exercise is a great option. SEEK MEDICAL CARE IF:  You have pain that is not relieved with rest or medicine.  You have pain that does not improve in 1 week.  You have new symptoms.  You are generally not feeling well. SEEK IMMEDIATE MEDICAL CARE IF:   You have pain that radiates from your back into your legs.  You develop new bowel or bladder control problems.  You have unusual weakness or numbness in your arms or legs.  You develop nausea or vomiting.  You develop abdominal pain.  You feel faint. Document Released: 07/06/2005 Document Revised: 01/05/2012 Document Reviewed: 11/07/2013 [facility] Patient Information 2015 [place], [place]. This information is not intended to replace advice given to you by your health care provider. Make sure you discuss any questions you have with your health care provider.

## 2014-09-21 NOTE — ED Provider Notes (Signed)
CSN: [id number]     Arrival date & time 09/21/14  1048 History   First MD Initiated Contact with Patient 09/21/14 1051     Chief Complaint  Patient presents with  . Back Pain     (Consider location/radiation/quality/duration/timing/severity/associated sxs/prior Treatment) HPI The patient reports that she's been having lower back pain that wraps around to her lower abdomen for about 2-3 days now. She reports it has a burning quality and it is symmetric. She denies she's had pain or burning with urination. She denies abnormal vaginal discharge or bleeding. The patient has not had any fever, vomiting or diarrhea. She does reports she's had some nausea. The patient reports of these are similar symptoms as to when she's had pneumonia in the past. She however has not had any cough or shortness of breath or chest pain. Past Medical History  Diagnosis Date  . Ovarian cyst   . Sickle cell trait   . Pneumonia    Past Surgical History  Procedure Laterality Date  . Ovarian cyst surgery    . Laparoscopy N/A 10/11/2012    Procedure: LAPAROSCOPY OPERATIVE;  Surgeon: Guss Bunde, MD;  Location: [facility];  Service: Gynecology;  Laterality: N/A;  . Laparotomy N/A 10/11/2012    Procedure: EXPLORATORY LAPAROTOMY.  Removal of  Ectopic pregnancy.  ;  Surgeon: Guss Bunde, MD;  Location: [facility];  Service: Gynecology;  Laterality: N/A;  . Lysis of adhesion N/A 10/11/2012    Procedure: LYSIS OF ADHESION;  Surgeon: Guss Bunde, MD;  Location: [facility];  Service: Gynecology;  Laterality: N/A;  . Unilateral salpingectomy Right 10/11/2012    Procedure: UNILATERAL SALPINGECTOMY;  Surgeon: Guss Bunde, MD;  Location: [facility];  Service: Gynecology;  Laterality: Right;   Family History  Problem Relation Age of Onset  . Alcohol abuse Neg Hx   . COPD Mother    History  Substance Use Topics  . Smoking status: Never Smoker   . Smokeless tobacco: Not on file  . Alcohol Use: No   OB History    Gravida Para  Term Preterm AB TAB SAB Ectopic Multiple Living   7 6 6  1   1  6      Review of Systems  10 Systems reviewed and are negative for acute change except as noted in the HPI.   Allergies  Tramadol  Home Medications   Prior to Admission medications   Medication Sig Start Date End Date Taking? Authorizing Provider  amoxicillin (AMOXIL) 500 MG capsule Take 1 capsule (500 mg total) by mouth 3 (three) times daily. Patient not taking: Reported on 09/21/2014 11/20/13   Larene Pickett, PA-C  ibuprofen (ADVIL,MOTRIN) 800 MG tablet Take 1 tablet (800 mg total) by mouth 3 (three) times daily. 09/21/14   Charlesetta Shanks, MD  orphenadrine (NORFLEX) 100 MG tablet Take 1 tablet (100 mg total) by mouth 2 (two) times daily. 09/21/14   Charlesetta Shanks, MD  traMADol (ULTRAM) 50 MG tablet Take 1 tablet (50 mg total) by mouth every 6 (six) hours as needed. Patient not taking: Reported on 09/21/2014 05/02/14   Noland Fordyce, PA-C   BP 113/73 mmHg  Pulse 84  Temp(Src) 98.8 F (37.1 C) (Oral)  Resp 12  SpO2 100%  LMP 09/09/2014 Physical Exam  Constitutional: She is oriented to person, place, and time. She appears well-developed and well-nourished.  HENT:  Head: Normocephalic and atraumatic.  Eyes: EOM are normal. Pupils are equal, round, and reactive to light.  Neck: Neck supple.  Cardiovascular: Normal rate, regular rhythm, normal heart sounds and intact distal pulses.   Pulmonary/Chest: Effort normal and breath sounds normal.  Abdominal: Soft. Bowel sounds are normal. She exhibits no distension. There is tenderness.  The patient endorses mild to moderate tenderness to palpation of the lower abdomen in the suprapubic region and bilaterally. She also endorses CVA tenderness to percussion on the back. There is no guarding.  Genitourinary:  External female genitalia normal. Speculum examination, moderate amount of yellow tinged mucus and discharge from cervix. Cervix is slightly friable. No bleeding or clot present.  Bimanual examination is positive for cervical motion tenderness, uterine tenderness and bilateral adnexal tenderness without any palpable mass or fullness.  Musculoskeletal: Normal range of motion. She exhibits no edema.  Neurological: She is alert and oriented to person, place, and time. She has normal strength. Coordination normal. GCS eye subscore is 4. GCS verbal subscore is 5. GCS motor subscore is 6.  Skin: Skin is warm, dry and intact.  Psychiatric: She has a normal mood and affect.    ED Course  Procedures (including critical care time) Labs Review Labs Reviewed  WET PREP, GENITAL - Abnormal; Notable for the following:    Clue Cells Wet Prep HPF POC FEW (*)    WBC, Wet Prep HPF POC MODERATE (*)    All other components within normal limits  URINALYSIS, ROUTINE W REFLEX MICROSCOPIC  POC URINE PREG, ED  GC/CHLAMYDIA PROBE AMP ([place])    Imaging Review No results found.   EKG Interpretation None      MDM   Final diagnoses:  Bilateral low back pain without sciatica  Pelvic pain in female   The patient presents with a chief complaint to triage of back pain. Her history is however for symmetric, very low back pain that wraps around to her lower abdomen. She has not had fever, vomiting or diarrhea. On pelvic examination she endorses significant cervical motion tenderness and diffuse pelvic tenderness. There is no mass present and the patient is nontoxic, up and ambulatory about the emergency department without difficulty. At this time she will be treated for cervicitis and prescribed ibuprofen and Norflex for back pain.    Charlesetta Shanks, MD 09/21/14 [phone]

## 2014-09-24 LAB — GC/CHLAMYDIA PROBE AMP (~~LOC~~) NOT AT ARMC
Chlamydia: NEGATIVE
Neisseria Gonorrhea: NEGATIVE

## 2016-07-20 HISTORY — PX: COLONOSCOPY: SHX174

## 2016-07-20 HISTORY — PX: ESOPHAGOGASTRODUODENOSCOPY: SHX1529

## 2016-07-20 HISTORY — PX: LAPAROSCOPIC CHOLECYSTECTOMY: SUR755

## 2016-08-13 ENCOUNTER — Encounter (HOSPITAL_COMMUNITY): Payer: Self-pay

## 2016-08-13 ENCOUNTER — Emergency Department (HOSPITAL_COMMUNITY)
Admission: EM | Admit: 2016-08-13 | Discharge: 2016-08-14 | Disposition: A | Discharge status: Home / Self Care | Payer: Medicaid Other | Attending: Emergency Medicine | Admitting: Emergency Medicine

## 2016-08-13 DIAGNOSIS — K8020 Calculus of gallbladder without cholecystitis without obstruction: Secondary | ICD-10-CM

## 2016-08-13 DIAGNOSIS — R1011 Right upper quadrant pain: Secondary | ICD-10-CM | POA: Diagnosis present

## 2016-08-13 LAB — COMPREHENSIVE METABOLIC PANEL
ALBUMIN: 3.9 g/dL (ref 3.5–5.0)
ALK PHOS: 70 U/L (ref 38–126)
ALT: 15 U/L (ref 14–54)
ANION GAP: 13 (ref 5–15)
AST: 21 U/L (ref 15–41)
BILIRUBIN TOTAL: 0.6 mg/dL (ref 0.3–1.2)
BUN: 9 mg/dL (ref 6–20)
CALCIUM: 9.6 mg/dL (ref 8.9–10.3)
CO2: 23 mmol/L (ref 22–32)
Chloride: 106 mmol/L (ref 101–111)
Creatinine, Ser: 0.75 mg/dL (ref 0.44–1.00)
GFR calc Af Amer: >60 mL/min (ref >60)
GLUCOSE: 126 mg/dL — AB (ref 65–99)
POTASSIUM: 3.2 mmol/L — AB (ref 3.5–5.1)
Sodium: 142 mmol/L (ref 135–145)
TOTAL PROTEIN: 7.9 g/dL (ref 6.5–8.1)

## 2016-08-13 LAB — URINALYSIS, ROUTINE W REFLEX MICROSCOPIC
BILIRUBIN URINE: NEGATIVE
GLUCOSE, UA: NEGATIVE mg/dL
HGB URINE DIPSTICK: NEGATIVE
Ketones, ur: 20 mg/dL — AB
NITRITE: NEGATIVE
PH: 8 (ref 5.0–8.0)
Protein, ur: NEGATIVE mg/dL
Specific Gravity, Urine: 1.013 (ref 1.005–1.030)

## 2016-08-13 LAB — CBC
HEMATOCRIT: 34 % — AB (ref 36.0–46.0)
Hemoglobin: 11.5 g/dL — ABNORMAL LOW (ref 12.0–15.0)
MCH: 28 pg (ref 26.0–34.0)
MCHC: 33.8 g/dL (ref 30.0–36.0)
MCV: 82.7 fL (ref 78.0–100.0)
Platelets: 382 10*3/uL (ref 150–400)
RBC: 4.11 MIL/uL (ref 3.87–5.11)
RDW: 14 % (ref 11.5–15.5)
WBC: 10.4 10*3/uL (ref 4.0–10.5)

## 2016-08-13 LAB — POC URINE PREG, ED: Preg Test, Ur: NEGATIVE

## 2016-08-13 LAB — LIPASE, BLOOD: Lipase: 20 U/L (ref 11–51)

## 2016-08-13 MED ORDER — ONDANSETRON 4 MG PO TBDP
ORAL_TABLET | ORAL | Status: AC
  Filled 2016-08-13: qty 1

## 2016-08-13 MED ORDER — ONDANSETRON 4 MG PO TBDP
4.0000 mg | ORAL_TABLET | Freq: Once | ORAL | Status: AC | PRN
  Administered 2016-08-13: 4 mg via ORAL

## 2016-08-13 NOTE — ED Triage Notes (Signed)
Pt states that she has had abd pain and vomiting since Monday, was admitted to [facility] for the flu and released yesterday and not feeling any better. Generalized abd pain.

## 2016-08-14 ENCOUNTER — Emergency Department (HOSPITAL_COMMUNITY): Payer: Medicaid Other

## 2016-08-14 MED ORDER — ONDANSETRON HCL 4 MG PO TABS: 4.0000 mg | ORAL_TABLET | Freq: Four times a day (QID) | ORAL | 0 refills | Status: DC

## 2016-08-14 MED ORDER — HYDROMORPHONE HCL 2 MG/ML IJ SOLN
1.0000 mg | Freq: Once | INTRAMUSCULAR | Status: AC
  Administered 2016-08-14: 1 mg via INTRAVENOUS
  Filled 2016-08-14: qty 1

## 2016-08-14 MED ORDER — ONDANSETRON HCL 4 MG/2ML IJ SOLN
4.0000 mg | Freq: Once | INTRAMUSCULAR | Status: AC
  Administered 2016-08-14: 4 mg via INTRAVENOUS
  Filled 2016-08-14: qty 2

## 2016-08-14 MED ORDER — OXYCODONE-ACETAMINOPHEN 5-325 MG PO TABS: 1.0000 | ORAL_TABLET | Freq: Four times a day (QID) | ORAL | 0 refills | Status: DC | PRN

## 2016-08-14 MED ORDER — SODIUM CHLORIDE 0.9 % IV BOLUS (SEPSIS)
1000.0000 mL | Freq: Once | INTRAVENOUS | Status: AC
  Administered 2016-08-14: 1000 mL via INTRAVENOUS

## 2016-08-14 NOTE — ED Provider Notes (Signed)
[facility] DEPT Provider Note   CSN: [id number] Arrival date & time: 08/13/16  1912     History   Chief Complaint Chief Complaint  Patient presents with  . Emesis  . Abdominal Pain    HPI Amy Heath is a 36 y.o. female.  Patient presents to the emergency department with chief complaints of abdominal pain. She states that she was recently admitted to the hospital at [facility] for flu symptoms. She states that upon being released she has had worsening right upper quadrant abdominal pain. She states the pain radiates to her back. She denies any fevers or chills. Reports having associated nausea and vomiting. She reports she still has her gallbladder. She has not taken anything for her symptoms. Her symptoms are worsened with palpation.   The history is provided by the patient. No language interpreter was used.    Past Medical History:  Diagnosis Date  . Ovarian cyst   . Pneumonia   . Sickle cell trait [facility])     Patient Active Problem List   Diagnosis Date Noted  . Viral gastroenteritis 09/23/2013  . Intractable nausea and vomiting 09/21/2013  . Abdominal pain, unspecified site 09/21/2013  . Hypokalemia 09/20/2013    Past Surgical History:  Procedure Laterality Date  . LAPAROSCOPY N/A 10/11/2012   Procedure: LAPAROSCOPY OPERATIVE;  Surgeon: Guss Bunde, MD;  Location: [facility];  Service: Gynecology;  Laterality: N/A;  . LAPAROTOMY N/A 10/11/2012   Procedure: EXPLORATORY LAPAROTOMY.  Removal of  Ectopic pregnancy.  ;  Surgeon: Guss Bunde, MD;  Location: [facility];  Service: Gynecology;  Laterality: N/A;  . LYSIS OF ADHESION N/A 10/11/2012   Procedure: LYSIS OF ADHESION;  Surgeon: Guss Bunde, MD;  Location: [facility];  Service: Gynecology;  Laterality: N/A;  . OVARIAN CYST SURGERY    . UNILATERAL SALPINGECTOMY Right 10/11/2012   Procedure: UNILATERAL SALPINGECTOMY;  Surgeon: Guss Bunde, MD;  Location: [facility];  Service: Gynecology;  Laterality:  Right;    OB History    Gravida Para Term Preterm AB Living   7 6 6   1 6    SAB TAB Ectopic Multiple Live Births       1           Home Medications    Prior to Admission medications   Medication Sig Start Date End Date Taking? Authorizing Provider  amoxicillin (AMOXIL) 500 MG capsule Take 1 capsule (500 mg total) by mouth 3 (three) times daily. Patient not taking: Reported on 09/21/2014 11/20/13   Larene Pickett, PA-C  ibuprofen (ADVIL,MOTRIN) 800 MG tablet Take 1 tablet (800 mg total) by mouth 3 (three) times daily. 09/21/14   Charlesetta Shanks, MD  orphenadrine (NORFLEX) 100 MG tablet Take 1 tablet (100 mg total) by mouth 2 (two) times daily. 09/21/14   Charlesetta Shanks, MD  traMADol (ULTRAM) 50 MG tablet Take 1 tablet (50 mg total) by mouth every 6 (six) hours as needed. Patient not taking: Reported on 09/21/2014 05/02/14   Noland Fordyce, PA-C    Family History Family History  Problem Relation Age of Onset  . COPD Mother   . Alcohol abuse Neg Hx     Social History Social History  Substance Use Topics  . Smoking status: Never Smoker  . Smokeless tobacco: Never Used  . Alcohol use No     Allergies   Tramadol   Review of Systems Review of Systems  Gastrointestinal: Positive for abdominal pain, nausea and vomiting.  All other systems reviewed and are negative.    Physical Exam Updated Vital Signs BP 144/86   Pulse 69   Temp 99.1 F (37.3 C) (Oral)   Resp 15   Ht 5\' 6"  (1.676 m)   Wt 73.7 kg   LMP 07/20/2016   SpO2 100%   BMI 26.22 kg/m   Physical Exam  Constitutional: She is oriented to person, place, and time. She appears well-developed and well-nourished.  HENT:  Head: Normocephalic and atraumatic.  Eyes: Conjunctivae and EOM are normal. Pupils are equal, round, and reactive to light.  Neck: Normal range of motion. Neck supple.  Cardiovascular: Normal rate and regular rhythm.  Exam reveals no gallop and no friction rub.   No murmur heard. Pulmonary/Chest:  Effort normal and breath sounds normal. No respiratory distress. She has no wheezes. She has no rales. She exhibits no tenderness.  Abdominal: Soft. Bowel sounds are normal. She exhibits no distension and no mass. There is tenderness. There is no rebound and no guarding.  Right upper quadrant tenderness palpation, no other focal abdominal tenderness  Musculoskeletal: Normal range of motion. She exhibits no edema or tenderness.  Neurological: She is alert and oriented to person, place, and time.  Skin: Skin is warm and dry.  Psychiatric: She has a normal mood and affect. Her behavior is normal. Judgment and thought content normal.  Nursing note and vitals reviewed.    ED Treatments / Results  Labs (all labs ordered are listed, but only abnormal results are displayed) Labs Reviewed  COMPREHENSIVE METABOLIC PANEL - Abnormal; Notable for the following:       Result Value   Potassium 3.2 (*)    Glucose, Bld 126 (*)    All other components within normal limits  CBC - Abnormal; Notable for the following:    Hemoglobin 11.5 (*)    HCT 34.0 (*)    All other components within normal limits  URINALYSIS, ROUTINE W REFLEX MICROSCOPIC - Abnormal; Notable for the following:    APPearance HAZY (*)    Ketones, ur 20 (*)    Leukocytes, UA SMALL (*)    Bacteria, UA RARE (*)    Squamous Epithelial / LPF 0-5 (*)    All other components within normal limits  LIPASE, BLOOD  POC URINE PREG, ED    EKG  EKG Interpretation None       Radiology US Abdomen Limited  Result Date: 08/14/2016 CLINICAL DATA:  36 year old female with abdominal pain, nausea vomiting. EXAM: US ABDOMEN LIMITED - RIGHT UPPER QUADRANT COMPARISON:  Abdominal CT dated 08/10/2016 FINDINGS: Gallbladder: There multiple stones within the gallbladder. There is no gallbladder wall thickening or pericholecystic fluid. Negative sonographic Murphy's sign. Common bile duct: Diameter: 4 mm Liver: No focal lesion identified. Within normal  limits in parenchymal echogenicity. IMPRESSION: Cholelithiasis without sonographic evidence acute cholecystitis. Electronically Signed   By: Anner Crete M.D.   On: 08/14/2016 03:09    Procedures Procedures (including critical care time)  Medications Ordered in ED Medications  ondansetron (ZOFRAN-ODT) disintegrating tablet 4 mg (4 mg Oral Given 08/13/16 1936)  sodium chloride 0.9 % bolus 1,000 mL (1,000 mLs Intravenous New Bag/Given 08/14/16 0119)  HYDROmorphone (DILAUDID) injection 1 mg (1 mg Intravenous Given 08/14/16 0120)  ondansetron (ZOFRAN) injection 4 mg (4 mg Intravenous Given 08/14/16 0120)     Initial Impression / Assessment and Plan / ED Course  I have reviewed the triage vital signs and the nursing notes.  Pertinent labs & imaging results  that were available during my care of the patient were reviewed by me and considered in my medical decision making (see chart for details).     Patient recently discharged from [facility] for flulike symptoms. Complains of worsening right upper quadrant abdominal pain. She has had nausea and vomiting. Will check right upper quadrant ultrasound. We'll treat pain, and will reassess.  Right upper quadrant ultrasound remarkable for cholelithiasis without evidence of cholecystitis. These findings are consistent with patient's symptoms. I have discussed the patient may need to have surgery, but as there is no evidence of acute cholecystitis, she can be discharged home at this time and follow-up with the general surgery team on an outpatient basis. I will prescribe pain meds and zofran.  Final Clinical Impressions(s) / ED Diagnoses   Final diagnoses:  Calculus of gallbladder without cholecystitis without obstruction    New Prescriptions New Prescriptions   ONDANSETRON (ZOFRAN) 4 MG TABLET    Take 1 tablet (4 mg total) by mouth every 6 (six) hours.   OXYCODONE-ACETAMINOPHEN (PERCOCET/ROXICET) 5-325 MG TABLET    Take 1-2  tablets by mouth every 6 (six) hours as needed for severe pain.     Montine Circle, PA-C 08/14/16 [id number]    Sherwood Gambler, MD 08/21/16 [phone]

## 2016-08-14 NOTE — ED Notes (Signed)
Provider at bedside

## 2019-12-25 ENCOUNTER — Encounter (HOSPITAL_COMMUNITY): Payer: Self-pay | Admitting: Emergency Medicine

## 2019-12-25 ENCOUNTER — Inpatient Hospital Stay (HOSPITAL_COMMUNITY)
Admission: EM | Admit: 2019-12-25 | Discharge: 2019-12-30 | DRG: 392 | Disposition: A | Discharge status: Home / Self Care | Payer: Medicaid Other | Attending: Student | Admitting: Student

## 2019-12-25 ENCOUNTER — Other Ambulatory Visit: Payer: Self-pay

## 2019-12-25 ENCOUNTER — Emergency Department (HOSPITAL_COMMUNITY): Payer: Medicaid Other

## 2019-12-25 DIAGNOSIS — E876 Hypokalemia: Secondary | ICD-10-CM | POA: Diagnosis not present

## 2019-12-25 DIAGNOSIS — Z79899 Other long term (current) drug therapy: Secondary | ICD-10-CM

## 2019-12-25 DIAGNOSIS — R059 Cough, unspecified: Secondary | ICD-10-CM

## 2019-12-25 DIAGNOSIS — Z885 Allergy status to narcotic agent status: Secondary | ICD-10-CM

## 2019-12-25 DIAGNOSIS — D573 Sickle-cell trait: Secondary | ICD-10-CM | POA: Diagnosis present

## 2019-12-25 DIAGNOSIS — K589 Irritable bowel syndrome without diarrhea: Secondary | ICD-10-CM | POA: Diagnosis present

## 2019-12-25 DIAGNOSIS — R112 Nausea with vomiting, unspecified: Secondary | ICD-10-CM | POA: Diagnosis not present

## 2019-12-25 DIAGNOSIS — A084 Viral intestinal infection, unspecified: Principal | ICD-10-CM | POA: Diagnosis present

## 2019-12-25 DIAGNOSIS — R1084 Generalized abdominal pain: Secondary | ICD-10-CM

## 2019-12-25 DIAGNOSIS — R197 Diarrhea, unspecified: Secondary | ICD-10-CM | POA: Diagnosis present

## 2019-12-25 DIAGNOSIS — Z20822 Contact with and (suspected) exposure to covid-19: Secondary | ICD-10-CM | POA: Diagnosis present

## 2019-12-25 DIAGNOSIS — E872 Acidosis: Secondary | ICD-10-CM | POA: Diagnosis present

## 2019-12-25 DIAGNOSIS — N179 Acute kidney failure, unspecified: Secondary | ICD-10-CM | POA: Diagnosis not present

## 2019-12-25 DIAGNOSIS — Z825 Family history of asthma and other chronic lower respiratory diseases: Secondary | ICD-10-CM

## 2019-12-25 LAB — URINALYSIS, MICROSCOPIC (REFLEX)

## 2019-12-25 LAB — CBC
HCT: 42.6 % (ref 36.0–46.0)
Hemoglobin: 14.8 g/dL (ref 12.0–15.0)
MCH: 28.4 pg (ref 26.0–34.0)
MCHC: 34.7 g/dL (ref 30.0–36.0)
MCV: 81.6 fL (ref 80.0–100.0)
Platelets: 412 10*3/uL — ABNORMAL HIGH (ref 150–400)
RBC: 5.22 MIL/uL — ABNORMAL HIGH (ref 3.87–5.11)
RDW: 13.3 % (ref 11.5–15.5)
WBC: 11 10*3/uL — ABNORMAL HIGH (ref 4.0–10.5)
nRBC: 0 % (ref 0.0–0.2)

## 2019-12-25 LAB — COMPREHENSIVE METABOLIC PANEL
ALT: 23 U/L (ref 0–44)
AST: 28 U/L (ref 15–41)
Albumin: 4.6 g/dL (ref 3.5–5.0)
Alkaline Phosphatase: 89 U/L (ref 38–126)
Anion gap: 17 — ABNORMAL HIGH (ref 5–15)
BUN: 22 mg/dL — ABNORMAL HIGH (ref 6–20)
CO2: 19 mmol/L — ABNORMAL LOW (ref 22–32)
Calcium: 9.5 mg/dL (ref 8.9–10.3)
Chloride: 98 mmol/L (ref 98–111)
Creatinine, Ser: 1.5 mg/dL — ABNORMAL HIGH (ref 0.44–1.00)
GFR calc Af Amer: 50 mL/min — ABNORMAL LOW (ref >60)
GFR calc non Af Amer: 43 mL/min — ABNORMAL LOW (ref >60)
Glucose, Bld: 133 mg/dL — ABNORMAL HIGH (ref 70–99)
Potassium: 2.5 mmol/L — CL (ref 3.5–5.1)
Sodium: 134 mmol/L — ABNORMAL LOW (ref 135–145)
Total Bilirubin: 0.7 mg/dL (ref 0.3–1.2)
Total Protein: 9.1 g/dL — ABNORMAL HIGH (ref 6.5–8.1)

## 2019-12-25 LAB — URINALYSIS, ROUTINE W REFLEX MICROSCOPIC

## 2019-12-25 LAB — I-STAT BETA HCG BLOOD, ED (MC, WL, AP ONLY): I-stat hCG, quantitative: <5 m[IU]/mL (ref <5)

## 2019-12-25 LAB — LIPASE, BLOOD: Lipase: 33 U/L (ref 11–51)

## 2019-12-25 LAB — SARS CORONAVIRUS 2 BY RT PCR (HOSPITAL ORDER, PERFORMED IN ~~LOC~~ HOSPITAL LAB): SARS Coronavirus 2: NEGATIVE

## 2019-12-25 MED ORDER — PROMETHAZINE HCL 25 MG/ML IJ SOLN
25.0000 mg | Freq: Once | INTRAMUSCULAR | Status: AC
  Administered 2019-12-25: 25 mg via INTRAVENOUS
  Filled 2019-12-25: qty 1

## 2019-12-25 MED ORDER — ENOXAPARIN SODIUM 40 MG/0.4ML ~~LOC~~ SOLN
40.0000 mg | Freq: Every day | SUBCUTANEOUS | Status: DC
  Administered 2019-12-25 – 2019-12-29 (×5): 40 mg via SUBCUTANEOUS
  Filled 2019-12-25 (×5): qty 0.4

## 2019-12-25 MED ORDER — FENTANYL CITRATE (PF) 100 MCG/2ML IJ SOLN
25.0000 ug | INTRAMUSCULAR | Status: DC | PRN
  Administered 2019-12-26 – 2019-12-30 (×17): 25 ug via INTRAVENOUS
  Filled 2019-12-25 (×17): qty 2

## 2019-12-25 MED ORDER — POTASSIUM CHLORIDE IN NACL 20-0.9 MEQ/L-% IV SOLN
Freq: Once | INTRAVENOUS | Status: AC
  Filled 2019-12-25: qty 1000

## 2019-12-25 MED ORDER — ONDANSETRON HCL 4 MG PO TABS: 4.0000 mg | ORAL_TABLET | Freq: Four times a day (QID) | ORAL | Status: DC | PRN

## 2019-12-25 MED ORDER — MORPHINE SULFATE (PF) 4 MG/ML IV SOLN
4.0000 mg | Freq: Once | INTRAVENOUS | Status: AC
  Administered 2019-12-25: 4 mg via INTRAVENOUS
  Filled 2019-12-25: qty 1

## 2019-12-25 MED ORDER — SODIUM CHLORIDE 0.9% FLUSH
3.0000 mL | Freq: Once | INTRAVENOUS | Status: AC
  Administered 2019-12-25: 3 mL via INTRAVENOUS

## 2019-12-25 MED ORDER — SODIUM CHLORIDE 0.9 % IV SOLN: INTRAVENOUS | Status: AC

## 2019-12-25 MED ORDER — POTASSIUM CHLORIDE 10 MEQ/100ML IV SOLN
10.0000 meq | INTRAVENOUS | Status: AC
  Administered 2019-12-25 – 2019-12-26 (×6): 10 meq via INTRAVENOUS
  Filled 2019-12-25 (×3): qty 100

## 2019-12-25 MED ORDER — FENTANYL CITRATE (PF) 100 MCG/2ML IJ SOLN
25.0000 ug | Freq: Once | INTRAMUSCULAR | Status: AC
  Administered 2019-12-25: 25 ug via INTRAVENOUS
  Filled 2019-12-25: qty 2

## 2019-12-25 MED ORDER — AMLODIPINE BESYLATE 5 MG PO TABS: 5.0000 mg | ORAL_TABLET | Freq: Every day | ORAL | Status: DC

## 2019-12-25 MED ORDER — ALBUTEROL SULFATE (2.5 MG/3ML) 0.083% IN NEBU: 3.0000 mL | INHALATION_SOLUTION | RESPIRATORY_TRACT | Status: DC | PRN

## 2019-12-25 MED ORDER — ONDANSETRON HCL 4 MG/2ML IJ SOLN
4.0000 mg | Freq: Four times a day (QID) | INTRAMUSCULAR | Status: DC | PRN
  Administered 2019-12-25 – 2019-12-30 (×14): 4 mg via INTRAVENOUS
  Filled 2019-12-25 (×14): qty 2

## 2019-12-25 MED ORDER — IOHEXOL 300 MG/ML  SOLN
100.0000 mL | Freq: Once | INTRAMUSCULAR | Status: AC | PRN
  Administered 2019-12-25: 100 mL via INTRAVENOUS

## 2019-12-25 NOTE — ED Notes (Signed)
Pt ambulated to the restroom.

## 2019-12-25 NOTE — ED Notes (Signed)
Pt transported to CT ?

## 2019-12-25 NOTE — ED Notes (Signed)
Two unsuccessful IV attempts.

## 2019-12-25 NOTE — H&P (Signed)
History and Physical    Samyiah Halvorsen YJE:[id number] DOB: 10-14-1980 DOA: 12/25/2019  PCP: [facility] (Inactive)  Patient coming from: Home.  Chief Complaint: Nausea vomiting and diarrhea.  HPI: Amy Heath is a 39 y.o. female with no significant past medical history presents to the ER with complaints of nausea vomiting and diarrhea.  Patient has been having the symptoms for last 3 days.  Also has mid abdominal crampy pain.  Denies any blood in the vomit or diarrhea.  Had subjective feeling of fever chills.  Patient states her son has been having the symptoms for last 2 days.  Denies any recent travel.  Has not had any abnormal food.  Last few days patient has been having productive cough.  ED Course: In the ER patient was hemodynamically stable but has been having recurrent episodes of nausea vomiting and abdominal discomfort.  CT abdomen pelvis does not show anything acute.  Covid test was negative and labs are remarkable for severe hypokalemia with potassium of 2.5 creatinine is increased from baseline to 1.5 and bicarb of 19.  WBC count is 11.  Patient admitted for hydration and replacement of potassium and close observation.  Review of Systems: As per HPI, rest all negative.   Past Medical History:  Diagnosis Date  . Ovarian cyst   . Pneumonia   . Sickle cell trait [facility])     Past Surgical History:  Procedure Laterality Date  . LAPAROSCOPY N/A 10/11/2012   Procedure: LAPAROSCOPY OPERATIVE;  Surgeon: Guss Bunde, MD;  Location: [facility];  Service: Gynecology;  Laterality: N/A;  . LAPAROTOMY N/A 10/11/2012   Procedure: EXPLORATORY LAPAROTOMY.  Removal of  Ectopic pregnancy.  ;  Surgeon: Guss Bunde, MD;  Location: [facility];  Service: Gynecology;  Laterality: N/A;  . LYSIS OF ADHESION N/A 10/11/2012   Procedure: LYSIS OF ADHESION;  Surgeon: Guss Bunde, MD;  Location: [facility];  Service: Gynecology;  Laterality: N/A;  . OVARIAN CYST SURGERY    .  UNILATERAL SALPINGECTOMY Right 10/11/2012   Procedure: UNILATERAL SALPINGECTOMY;  Surgeon: Guss Bunde, MD;  Location: [facility];  Service: Gynecology;  Laterality: Right;     reports that she has never smoked. She has never used smokeless tobacco. She reports that she does not drink alcohol or use drugs.  Allergies  Allergen Reactions  . Tramadol Itching    Family History  Problem Relation Age of Onset  . COPD Mother   . Alcohol abuse Neg Hx     Prior to Admission medications   Medication Sig Start Date End Date Taking? Authorizing Provider  acetaminophen (TYLENOL) 500 MG tablet Take 1,000 mg by mouth every 6 (six) hours as needed for mild pain.   Yes [provider]  albuterol (VENTOLIN HFA) 108 (90 Base) MCG/ACT inhaler Inhale 2 puffs into the lungs every 4 (four) hours as needed for wheezing or shortness of breath. 10/26/19  Yes [provider]  amLODipine (NORVASC) 5 MG tablet Take 5 mg by mouth daily. 10/17/19  Yes [provider]  cetirizine (ZYRTEC) 10 MG tablet Take 10 mg by mouth daily. 10/26/19  Yes [provider]  fluticasone (FLONASE) 50 MCG/ACT nasal spray Place 2 sprays into both nostrils daily as needed for allergies or rhinitis.   Yes [provider]  amoxicillin (AMOXIL) 500 MG capsule Take 1 capsule (500 mg total) by mouth 3 (three) times daily. Patient not taking: Reported on 09/21/2014 11/20/13   [facility],  Vilinda Blanks, PA-C  ibuprofen (ADVIL,MOTRIN) 800 MG tablet Take 1 tablet (800 mg total) by mouth 3 (three) times daily. Patient not taking: Reported on 08/14/2016 09/21/14   Charlesetta Shanks, MD  ondansetron (ZOFRAN) 4 MG tablet Take 1 tablet (4 mg total) by mouth every 6 (six) hours. Patient not taking: Reported on 12/25/2019 08/14/16   Montine Circle, PA-C  orphenadrine (NORFLEX) 100 MG tablet Take 1 tablet (100 mg total) by mouth 2 (two) times daily. Patient not taking: Reported on 08/14/2016 09/21/14   Charlesetta Shanks, MD    oxyCODONE-acetaminophen (PERCOCET/ROXICET) 5-325 MG tablet Take 1-2 tablets by mouth every 6 (six) hours as needed for severe pain. Patient not taking: Reported on 12/25/2019 08/14/16   Montine Circle, PA-C  traMADol (ULTRAM) 50 MG tablet Take 1 tablet (50 mg total) by mouth every 6 (six) hours as needed. Patient not taking: Reported on 09/21/2014 05/02/14   Tyrell Antonio    Physical Exam: Constitutional: Moderately built and nourished. Vitals:   12/25/19 1726 12/25/19 1745 12/25/19 1845 12/25/19 2100  BP: (!) 137/91 (!) 153/81 127/80 108/84  Pulse: (!) 110 86 84 (!) 112  Resp: 20   (!) 22  Temp:      TempSrc:      SpO2: 100% 100% 98% 100%   Eyes: Anicteric no pallor. ENMT: No discharge from the ears eyes nose or mouth. Neck: No mass felt.  No neck rigidity. Respiratory: No rhonchi or crepitations. Cardiovascular: S1-S2 heard. Abdomen: Soft nontender bowel sounds present. Musculoskeletal: No edema. Skin: No rash. Neurologic: Alert awake oriented to time place and person.  Moves all extremities. Psychiatric: Appears normal per normal affect.   Labs on Admission: I have personally reviewed following labs and imaging studies  CBC: Recent Labs  Lab 12/25/19 1331  WBC 11.0*  HGB 14.8  HCT 42.6  MCV 81.6  PLT 106*   Basic Metabolic Panel: Recent Labs  Lab 12/25/19 1331  NA 134*  K 2.5*  CL 98  CO2 19*  GLUCOSE 133*  BUN 22*  CREATININE 1.50*  CALCIUM 9.5   GFR: CrCl cannot be calculated (Unknown ideal weight.). Liver Function Tests: Recent Labs  Lab 12/25/19 1331  AST 28  ALT 23  ALKPHOS 89  BILITOT 0.7  PROT 9.1*  ALBUMIN 4.6   Recent Labs  Lab 12/25/19 1331  LIPASE 33   No results for input(s): AMMONIA in the last 168 hours. Coagulation Profile: No results for input(s): INR, PROTIME in the last 168 hours. Cardiac Enzymes: No results for input(s): CKTOTAL, CKMB, CKMBINDEX, TROPONINI in the last 168 hours. BNP (last 3 results) No results  for input(s): PROBNP in the last 8760 hours. HbA1C: No results for input(s): HGBA1C in the last 72 hours. CBG: No results for input(s): GLUCAP in the last 168 hours. Lipid Profile: No results for input(s): CHOL, HDL, LDLCALC, TRIG, CHOLHDL, LDLDIRECT in the last 72 hours. Thyroid Function Tests: No results for input(s): TSH, T4TOTAL, FREET4, T3FREE, THYROIDAB in the last 72 hours. Anemia Panel: No results for input(s): VITAMINB12, FOLATE, FERRITIN, TIBC, IRON, RETICCTPCT in the last 72 hours. Urine analysis:    Component Value Date/Time   COLORURINE RED (A) 12/25/2019 1554   APPEARANCEUR TURBID (A) 12/25/2019 1554   LABSPEC  12/25/2019 1554    TEST NOT REPORTED DUE TO COLOR INTERFERENCE OF URINE PIGMENT   PHURINE  12/25/2019 1554    TEST NOT REPORTED DUE TO COLOR INTERFERENCE OF URINE PIGMENT   GLUCOSEU (A) 12/25/2019 1554  TEST NOT REPORTED DUE TO COLOR INTERFERENCE OF URINE PIGMENT   HGBUR (A) 12/25/2019 1554    TEST NOT REPORTED DUE TO COLOR INTERFERENCE OF URINE PIGMENT   BILIRUBINUR (A) 12/25/2019 1554    TEST NOT REPORTED DUE TO COLOR INTERFERENCE OF URINE PIGMENT   KETONESUR (A) 12/25/2019 1554    TEST NOT REPORTED DUE TO COLOR INTERFERENCE OF URINE PIGMENT   PROTEINUR (A) 12/25/2019 1554    TEST NOT REPORTED DUE TO COLOR INTERFERENCE OF URINE PIGMENT   UROBILINOGEN 1.0 09/21/2014 1200   NITRITE (A) 12/25/2019 1554    TEST NOT REPORTED DUE TO COLOR INTERFERENCE OF URINE PIGMENT   LEUKOCYTESUR (A) 12/25/2019 1554    TEST NOT REPORTED DUE TO COLOR INTERFERENCE OF URINE PIGMENT   Sepsis Labs: @LABRCNTIP (procalcitonin:4,lacticidven:4) ) Recent Results (from the past 240 hour(s))  SARS Coronavirus 2 by RT PCR (hospital order, performed in [facility] hospital lab) Nasopharyngeal Nasopharyngeal Swab     Status: None   Collection Time: 12/25/19  8:14 PM   Specimen: Nasopharyngeal Swab  Result Value Ref Range Status   SARS Coronavirus 2 NEGATIVE NEGATIVE Final     Comment: (NOTE) SARS-CoV-2 target nucleic acids are NOT DETECTED. The SARS-CoV-2 RNA is generally detectable in upper and lower respiratory specimens during the acute phase of infection. The lowest concentration of SARS-CoV-2 viral copies this assay can detect is 250 copies / mL. A negative result does not preclude SARS-CoV-2 infection and should not be used as the sole basis for treatment or other patient management decisions.  A negative result may occur with improper specimen collection / handling, submission of specimen other than nasopharyngeal swab, presence of viral mutation(s) within the areas targeted by this assay, and inadequate number of viral copies (<250 copies / mL). A negative result must be combined with clinical observations, patient history, and epidemiological information. Fact Sheet for Patients:   StrictlyIdeas.no Fact Sheet for Healthcare Providers: BankingDealers.co.za This test is not yet approved or cleared  by the [place] FDA and has been authorized for detection and/or diagnosis of SARS-CoV-2 by FDA under an Emergency Use Authorization (EUA).  This EUA will remain in effect (meaning this test can be used) for the duration of the COVID-19 declaration under Section 564(b)(1) of the Act, 21 U.S.C. section 360bbb-3(b)(1), unless the authorization is terminated or revoked sooner. Performed at [facility], [place] [address], [place], [place] [address]      Radiological Exams on Admission: CT Abdomen Pelvis W Contrast  Result Date: 12/25/2019 CLINICAL DATA:  Abdominal pain EXAM: CT ABDOMEN AND PELVIS WITH CONTRAST TECHNIQUE: Multidetector CT imaging of the abdomen and pelvis was performed using the standard protocol following bolus administration of intravenous contrast. CONTRAST:  140mL OMNIPAQUE IOHEXOL 300 MG/ML  SOLN COMPARISON:  March 02, 2019 FINDINGS: Lower chest: Lung bases are clear.  There is a  small hiatal hernia. Hepatobiliary: No focal liver lesions are appreciable. The gallbladder is absent. There is no appreciable biliary duct dilatation. Pancreas: There is no pancreatic mass or inflammatory focus. Spleen: No splenic lesions are evident. Adrenals/Urinary Tract: Adrenals bilaterally appear normal. Kidneys bilaterally show no evident mass or hydronephrosis on either side. There is no evident renal or ureteral calculus on either side. Urinary bladder is midline with wall thickness within normal limits. Stomach/Bowel: There is no appreciable bowel wall or mesenteric thickening. The terminal ileum appears unremarkable. There is mild lipomatous infiltration of the ileocecal valve. There is no bowel obstruction. There is no free air  or portal venous air. Vascular/Lymphatic: No abdominal aortic aneurysm. No arterial vascular lesions evident. Major venous structures appear patent. There is no evident adenopathy in the abdomen or pelvis. Reproductive: Uterus anteverted.  No evident pelvic mass. Other: Appendix appears normal. No abscess or ascites in the abdomen or pelvis. Musculoskeletal: No blastic or lytic bone lesions. No intramuscular or abdominal wall lesions are evident. IMPRESSION: 1. No appreciable bowel wall or mesenteric thickening. No evident bowel obstruction. No abscess in the abdomen or pelvis. Appendix appears normal. 2. No renal or ureteral calculus. No hydronephrosis. Urinary bladder wall thickness normal. 3.  Small hiatal hernia. 4.  Gallbladder absent. Electronically Signed   By: Lowella Grip III M.D.   On: 12/25/2019 19:08     Assessment/Plan Principal Problem:   Nausea & vomiting Active Problems:   Hypokalemia   ARF (acute renal failure) ([place])    1. Intractable nausea vomiting and diarrhea could be gastroenteritis given that patient's son also has similar symptoms.  Patient is afebrile at this time and CT abdomen pelvis is unremarkable.  We will gently hydrate and closely  observe clinically.  If there is any further episodes of diarrhea will get stool studies.  Denies taking any recent antibiotics. 2. Hypokalemia likely from nausea vomiting.  Replace recheck.  Check magnesium. 3. Acute renal failure from nausea vomiting gently hydrate and follow metabolic panel. 4. Productive cough for last few days.  Patient is afebrile.  Will check chest x-ray.   DVT prophylaxis: Lovenox. Code Status: Full code. Family Communication: Discussed with patient. Disposition Plan: Home. Consults called: None. Admission status: Observation.   Rise Patience MD [facility] Pager [phone].  If 7PM-7AM, please contact night-coverage [email or web address] Password [facility]  12/25/2019, 10:32 PM

## 2019-12-25 NOTE — ED Provider Notes (Signed)
[facility] EMERGENCY DEPARTMENT Provider Note   CSN: [id number] Arrival date & time: 12/25/19  1043     History Chief Complaint  Patient presents with  . Emesis  . Nausea  . Abdominal Pain    Jazmin Ley is a 39 y.o. female.  HPI    Patient with multiple medical issues including likely IBS now presents with 3 days of nausea, vomiting, diarrhea, abdominal pain. She notes that she has a provisional diagnosis of IBS, but was unable to follow-up with GI due to the Covid pandemic. She has had multiple prior similar episodes as when she is currently experiencing, but none recently. She notes that she was taking medication for GI illness until a few days ago, and ran out.  Not long thereafter the patient developed diffuse abdominal discomfort, sore, moderate, with associated anorexia, nausea, vomiting, diarrhea.  Since onset she has been unable to tolerate any p.o. intake, or medication for relief. She has had persistent nausea, vomiting, and with worsening weakness she presents for evaluation. Patient is previously seen GI providers at [facility], but not in at least the last 18 months.  Past Medical History:  Diagnosis Date  . Ovarian cyst   . Pneumonia   . Sickle cell trait [facility])     Patient Active Problem List   Diagnosis Date Noted  . Viral gastroenteritis 09/23/2013  . Intractable nausea and vomiting 09/21/2013  . Abdominal pain, unspecified site 09/21/2013  . Hypokalemia 09/20/2013    Past Surgical History:  Procedure Laterality Date  . LAPAROSCOPY N/A 10/11/2012   Procedure: LAPAROSCOPY OPERATIVE;  Surgeon: Guss Bunde, MD;  Location: [facility];  Service: Gynecology;  Laterality: N/A;  . LAPAROTOMY N/A 10/11/2012   Procedure: EXPLORATORY LAPAROTOMY.  Removal of  Ectopic pregnancy.  ;  Surgeon: Guss Bunde, MD;  Location: [facility];  Service: Gynecology;  Laterality: N/A;  . LYSIS OF ADHESION N/A 10/11/2012   Procedure: LYSIS OF  ADHESION;  Surgeon: Guss Bunde, MD;  Location: [facility] [facility];  Service: Gynecology;  Laterality: N/A;  . OVARIAN CYST SURGERY    . UNILATERAL SALPINGECTOMY Right 10/11/2012   Procedure: UNILATERAL SALPINGECTOMY;  Surgeon: Guss Bunde, MD;  Location: [facility];  Service: Gynecology;  Laterality: Right;     OB History    Gravida  7   Para  6   Term  6   Preterm      AB  1   Living  6     SAB      TAB      Ectopic  1   Multiple      Live Births              Family History  Problem Relation Age of Onset  . COPD Mother   . Alcohol abuse Neg Hx     Social History   Tobacco Use  . Smoking status: Never Smoker  . Smokeless tobacco: Never Used  Substance Use Topics  . Alcohol use: No  . Drug use: No    Comment: 1 month ago    Home Medications Prior to Admission medications   Medication Sig Start Date End Date Taking? Authorizing Provider  amoxicillin (AMOXIL) 500 MG capsule Take 1 capsule (500 mg total) by mouth 3 (three) times daily. Patient not taking: Reported on 09/21/2014 11/20/13   Larene Pickett, PA-C  ibuprofen (ADVIL,MOTRIN) 800 MG tablet Take 1 tablet (800 mg total) by mouth 3 (three) times  daily. Patient not taking: Reported on 08/14/2016 09/21/14   Charlesetta Shanks, MD  ondansetron (ZOFRAN) 4 MG tablet Take 1 tablet (4 mg total) by mouth every 6 (six) hours. 08/14/16   Montine Circle, PA-C  orphenadrine (NORFLEX) 100 MG tablet Take 1 tablet (100 mg total) by mouth 2 (two) times daily. Patient not taking: Reported on 08/14/2016 09/21/14   Charlesetta Shanks, MD  oxyCODONE-acetaminophen (PERCOCET/ROXICET) 5-325 MG tablet Take 1-2 tablets by mouth every 6 (six) hours as needed for severe pain. 08/14/16   Montine Circle, PA-C  traMADol (ULTRAM) 50 MG tablet Take 1 tablet (50 mg total) by mouth every 6 (six) hours as needed. Patient not taking: Reported on 09/21/2014 05/02/14   Noe Gens, PA-C    Allergies    Tramadol  Review of Systems   Review of Systems    Constitutional:       Per HPI, otherwise negative  HENT:       Per HPI, otherwise negative  Respiratory:       Per HPI, otherwise negative  Cardiovascular:       Per HPI, otherwise negative  Gastrointestinal: Positive for abdominal pain, diarrhea, nausea and vomiting.  Endocrine:       Negative aside from HPI  Genitourinary:       Neg aside from HPI   Musculoskeletal:       Per HPI, otherwise negative  Skin: Negative.   Neurological: Positive for weakness. Negative for syncope.    Physical Exam Updated Vital Signs BP (!) 133/98 (BP Location: Right Arm)   Pulse 95   Temp 98.1 F (36.7 C) (Oral)   Resp 20   LMP 12/25/2019   SpO2 99%   Physical Exam Vitals and nursing note reviewed.  Constitutional:      General: She is not in acute distress.    Appearance: She is well-developed.  HENT:     Head: Normocephalic and atraumatic.  Eyes:     Conjunctiva/sclera: Conjunctivae normal.  Cardiovascular:     Rate and Rhythm: Regular rhythm. Tachycardia present.  Pulmonary:     Effort: Pulmonary effort is normal. No respiratory distress.     Breath sounds: Normal breath sounds. No stridor.  Abdominal:     General: There is no distension.     Tenderness: There is generalized abdominal tenderness.  Skin:    General: Skin is warm and dry.  Neurological:     Mental Status: She is alert and oriented to person, place, and time.     Cranial Nerves: No cranial nerve deficit.     ED Results / Procedures / Treatments   Labs (all labs ordered are listed, but only abnormal results are displayed) Labs Reviewed  COMPREHENSIVE METABOLIC PANEL - Abnormal; Notable for the following components:      Result Value   Sodium 134 (*)    Potassium 2.5 (*)    CO2 19 (*)    Glucose, Bld 133 (*)    BUN 22 (*)    Creatinine, Ser 1.50 (*)    Total Protein 9.1 (*)    GFR calc non Af Amer 43 (*)    GFR calc Af Amer 50 (*)    Anion gap 17 (*)    All other components within normal limits   CBC - Abnormal; Notable for the following components:   WBC 11.0 (*)    RBC 5.22 (*)    Platelets 412 (*)    All other components within normal limits  URINALYSIS, ROUTINE  W REFLEX MICROSCOPIC - Abnormal; Notable for the following components:   Color, Urine RED (*)    APPearance TURBID (*)    Glucose, UA   (*)    Value: TEST NOT REPORTED DUE TO COLOR INTERFERENCE OF URINE PIGMENT   Hgb urine dipstick   (*)    Value: TEST NOT REPORTED DUE TO COLOR INTERFERENCE OF URINE PIGMENT   Bilirubin Urine   (*)    Value: TEST NOT REPORTED DUE TO COLOR INTERFERENCE OF URINE PIGMENT   Ketones, ur   (*)    Value: TEST NOT REPORTED DUE TO COLOR INTERFERENCE OF URINE PIGMENT   Protein, ur   (*)    Value: TEST NOT REPORTED DUE TO COLOR INTERFERENCE OF URINE PIGMENT   Nitrite   (*)    Value: TEST NOT REPORTED DUE TO COLOR INTERFERENCE OF URINE PIGMENT   Leukocytes,Ua   (*)    Value: TEST NOT REPORTED DUE TO COLOR INTERFERENCE OF URINE PIGMENT   All other components within normal limits  URINALYSIS, MICROSCOPIC (REFLEX) - Abnormal; Notable for the following components:   Bacteria, UA RARE (*)    All other components within normal limits  SARS CORONAVIRUS 2 BY RT PCR (HOSPITAL ORDER, [facility])  LIPASE, BLOOD  I-STAT BETA HCG BLOOD, ED ([facility], [facility], [facility] ONLY)    Radiology CT Abdomen Pelvis W Contrast  Result Date: 12/25/2019 CLINICAL DATA:  Abdominal pain EXAM: CT ABDOMEN AND PELVIS WITH CONTRAST TECHNIQUE: Multidetector CT imaging of the abdomen and pelvis was performed using the standard protocol following bolus administration of intravenous contrast. CONTRAST:  165mL OMNIPAQUE IOHEXOL 300 MG/ML  SOLN COMPARISON:  March 02, 2019 FINDINGS: Lower chest: Lung bases are clear.  There is a small hiatal hernia. Hepatobiliary: No focal liver lesions are appreciable. The gallbladder is absent. There is no appreciable biliary duct dilatation. Pancreas: There is no pancreatic mass or  inflammatory focus. Spleen: No splenic lesions are evident. Adrenals/Urinary Tract: Adrenals bilaterally appear normal. Kidneys bilaterally show no evident mass or hydronephrosis on either side. There is no evident renal or ureteral calculus on either side. Urinary bladder is midline with wall thickness within normal limits. Stomach/Bowel: There is no appreciable bowel wall or mesenteric thickening. The terminal ileum appears unremarkable. There is mild lipomatous infiltration of the ileocecal valve. There is no bowel obstruction. There is no free air or portal venous air. Vascular/Lymphatic: No abdominal aortic aneurysm. No arterial vascular lesions evident. Major venous structures appear patent. There is no evident adenopathy in the abdomen or pelvis. Reproductive: Uterus anteverted.  No evident pelvic mass. Other: Appendix appears normal. No abscess or ascites in the abdomen or pelvis. Musculoskeletal: No blastic or lytic bone lesions. No intramuscular or abdominal wall lesions are evident. IMPRESSION: 1. No appreciable bowel wall or mesenteric thickening. No evident bowel obstruction. No abscess in the abdomen or pelvis. Appendix appears normal. 2. No renal or ureteral calculus. No hydronephrosis. Urinary bladder wall thickness normal. 3.  Small hiatal hernia. 4.  Gallbladder absent. Electronically Signed   By: Lowella Grip III M.D.   On: 12/25/2019 19:08    Procedures Procedures (including critical care time)  Medications Ordered in ED Medications  potassium chloride 10 mEq in 100 mL IVPB (10 mEq Intravenous New Bag/Given 12/25/19 2012)  sodium chloride flush (NS) 0.9 % injection 3 mL (3 mLs Intravenous Given 12/25/19 2010)  0.9 % NaCl with KCl 20 mEq/ L  infusion ( Intravenous Stopped 12/25/19 2010)  promethazine (PHENERGAN) injection 25 mg (25 mg Intravenous Given 12/25/19 1728)  morphine 4 MG/ML injection 4 mg (4 mg Intravenous Given 12/25/19 1832)  iohexol (OMNIPAQUE) 300 MG/ML solution 100 mL (100  mLs Intravenous Contrast Given 12/25/19 1804)    ED Course  I have reviewed the triage vital signs and the nursing notes.  Pertinent labs & imaging results that were available during my care of the patient were reviewed by me and considered in my medical decision making (see chart for details).    With consideration of persistent nausea, vomiting, dehydration, electrolyte disturbance, infection, the patient had labs, CT ordered. Fluid resuscitation, Phenergan, also ordered after initial evaluation.  Update: Patient remains nauseous, though w/o vomiting.  CT reviewed, no alarming findings.  8:23 PM Patient remains nauseous, pain has been addressed somewhat with Phenergan, morphine. She has nearly completed 1/2 of K+, but w hypoK of 2.5, she will require additional IV repletion.  Patient presents with 3 days of nausea, vomiting, diarrhea, abdominal pain.  She does possibly have a history of IBS, though no confirmatory visit with GI. Here she is awake and alert, afebrile, but with pain, weakness, labs, CT were ordered.  Findings most notable for demonstration of substantial abnormality potassium, requiring IV repletion, as well as fluid repletion for her weakness. CT scan reassuring, the patient did have some improvement in the emergency department.  MDM Number of Diagnoses or Management Options Generalized abdominal pain: new, needed workup Hypokalemia: new, needed workup Nausea vomiting and diarrhea: new, needed workup   Amount and/or Complexity of Data Reviewed Clinical lab tests: reviewed Tests in the radiology section of CPT: reviewed Tests in the medicine section of CPT: reviewed Decide to obtain previous medical records or to obtain history from someone other than the patient: yes Review and summarize past medical records: yes Discuss the patient with other providers: yes Independent visualization of images, tracings, or specimens: yes  Risk of Complications, Morbidity,  and/or Mortality Presenting problems: high Diagnostic procedures: high Management options: high    Final Clinical Impression(s) / ED Diagnoses Final diagnoses:  Nausea vomiting and diarrhea  Generalized abdominal pain  Hypokalemia     Carmin Muskrat, MD 12/25/19 2029

## 2019-12-25 NOTE — ED Triage Notes (Signed)
Pt. Stated, I started having N/V and abdominal pain for 3 days. Pt. Vomiting at SORT.

## 2019-12-25 NOTE — ED Notes (Signed)
Attempted report 

## 2019-12-26 ENCOUNTER — Other Ambulatory Visit: Payer: Self-pay

## 2019-12-26 ENCOUNTER — Observation Stay (HOSPITAL_COMMUNITY): Payer: Medicaid Other

## 2019-12-26 DIAGNOSIS — Z79899 Other long term (current) drug therapy: Secondary | ICD-10-CM | POA: Diagnosis not present

## 2019-12-26 DIAGNOSIS — K589 Irritable bowel syndrome without diarrhea: Secondary | ICD-10-CM | POA: Diagnosis present

## 2019-12-26 DIAGNOSIS — A09 Infectious gastroenteritis and colitis, unspecified: Secondary | ICD-10-CM | POA: Diagnosis not present

## 2019-12-26 DIAGNOSIS — A0811 Acute gastroenteropathy due to Norwalk agent: Secondary | ICD-10-CM | POA: Diagnosis not present

## 2019-12-26 DIAGNOSIS — A084 Viral intestinal infection, unspecified: Secondary | ICD-10-CM | POA: Diagnosis not present

## 2019-12-26 DIAGNOSIS — E872 Acidosis: Secondary | ICD-10-CM | POA: Diagnosis present

## 2019-12-26 DIAGNOSIS — D573 Sickle-cell trait: Secondary | ICD-10-CM | POA: Diagnosis present

## 2019-12-26 DIAGNOSIS — N179 Acute kidney failure, unspecified: Secondary | ICD-10-CM | POA: Diagnosis present

## 2019-12-26 DIAGNOSIS — R112 Nausea with vomiting, unspecified: Secondary | ICD-10-CM | POA: Diagnosis not present

## 2019-12-26 DIAGNOSIS — Z825 Family history of asthma and other chronic lower respiratory diseases: Secondary | ICD-10-CM | POA: Diagnosis not present

## 2019-12-26 DIAGNOSIS — E876 Hypokalemia: Secondary | ICD-10-CM | POA: Diagnosis not present

## 2019-12-26 DIAGNOSIS — Z20822 Contact with and (suspected) exposure to covid-19: Secondary | ICD-10-CM | POA: Diagnosis present

## 2019-12-26 DIAGNOSIS — Z885 Allergy status to narcotic agent status: Secondary | ICD-10-CM | POA: Diagnosis not present

## 2019-12-26 LAB — MAGNESIUM
Magnesium: 2 mg/dL (ref 1.7–2.4)
Magnesium: 1.9 mg/dL (ref 1.7–2.4)

## 2019-12-26 LAB — CBC WITH DIFFERENTIAL/PLATELET
Abs Immature Granulocytes: 0.04 10*3/uL (ref 0.00–0.07)
Basophils Absolute: 0 10*3/uL (ref 0.0–0.1)
Basophils Relative: 0 %
Eosinophils Absolute: 0 10*3/uL (ref 0.0–0.5)
Eosinophils Relative: 0 %
HCT: 36.7 % (ref 36.0–46.0)
Hemoglobin: 12.4 g/dL (ref 12.0–15.0)
Immature Granulocytes: 0 %
Lymphocytes Relative: 29 %
Lymphs Abs: 3.2 10*3/uL (ref 0.7–4.0)
MCH: 28.5 pg (ref 26.0–34.0)
MCHC: 33.8 g/dL (ref 30.0–36.0)
MCV: 84.4 fL (ref 80.0–100.0)
Monocytes Absolute: 1.6 10*3/uL — ABNORMAL HIGH (ref 0.1–1.0)
Monocytes Relative: 14 %
Neutro Abs: 6.3 10*3/uL (ref 1.7–7.7)
Neutrophils Relative %: 57 %
Platelets: 332 10*3/uL (ref 150–400)
RBC: 4.35 MIL/uL (ref 3.87–5.11)
RDW: 13.6 % (ref 11.5–15.5)
WBC: 11.1 10*3/uL — ABNORMAL HIGH (ref 4.0–10.5)
nRBC: 0 % (ref 0.0–0.2)

## 2019-12-26 LAB — BASIC METABOLIC PANEL
Anion gap: 12 (ref 5–15)
Anion gap: 12 (ref 5–15)
BUN: 14 mg/dL (ref 6–20)
BUN: 14 mg/dL (ref 6–20)
CO2: 23 mmol/L (ref 22–32)
CO2: 19 mmol/L — ABNORMAL LOW (ref 22–32)
Calcium: 8.3 mg/dL — ABNORMAL LOW (ref 8.9–10.3)
Calcium: 7.9 mg/dL — ABNORMAL LOW (ref 8.9–10.3)
Chloride: 103 mmol/L (ref 98–111)
Chloride: 107 mmol/L (ref 98–111)
Creatinine, Ser: 0.97 mg/dL (ref 0.44–1.00)
Creatinine, Ser: 0.95 mg/dL (ref 0.44–1.00)
GFR calc Af Amer: >60 mL/min (ref >60)
GFR calc Af Amer: >60 mL/min (ref >60)
GFR calc non Af Amer: >60 mL/min (ref >60)
GFR calc non Af Amer: >60 mL/min (ref >60)
Glucose, Bld: 112 mg/dL — ABNORMAL HIGH (ref 70–99)
Glucose, Bld: 106 mg/dL — ABNORMAL HIGH (ref 70–99)
Potassium: 2.8 mmol/L — ABNORMAL LOW (ref 3.5–5.1)
Potassium: 3.3 mmol/L — ABNORMAL LOW (ref 3.5–5.1)
Sodium: 138 mmol/L (ref 135–145)
Sodium: 138 mmol/L (ref 135–145)

## 2019-12-26 LAB — HEPATIC FUNCTION PANEL
ALT: 19 U/L (ref 0–44)
AST: 23 U/L (ref 15–41)
Albumin: 3.5 g/dL (ref 3.5–5.0)
Alkaline Phosphatase: 71 U/L (ref 38–126)
Bilirubin, Direct: <0.1 mg/dL (ref 0.0–0.2)
Total Bilirubin: 0.5 mg/dL (ref 0.3–1.2)
Total Protein: 6.9 g/dL (ref 6.5–8.1)

## 2019-12-26 LAB — HIV ANTIBODY (ROUTINE TESTING W REFLEX): HIV Screen 4th Generation wRfx: NONREACTIVE

## 2019-12-26 MED ORDER — ADULT MULTIVITAMIN W/MINERALS CH
1.0000 | ORAL_TABLET | Freq: Every day | ORAL | Status: DC
  Filled 2019-12-26: qty 1

## 2019-12-26 MED ORDER — POTASSIUM CHLORIDE 10 MEQ/100ML IV SOLN
10.0000 meq | INTRAVENOUS | Status: AC
  Administered 2019-12-26 (×4): 10 meq via INTRAVENOUS
  Filled 2019-12-26 (×4): qty 100

## 2019-12-26 MED ORDER — BOOST / RESOURCE BREEZE PO LIQD CUSTOM
1.0000 | Freq: Three times a day (TID) | ORAL | Status: DC
  Administered 2019-12-26 – 2019-12-29 (×4): 1 via ORAL

## 2019-12-26 MED ORDER — HYDROCODONE-ACETAMINOPHEN 5-325 MG PO TABS
1.0000 | ORAL_TABLET | ORAL | Status: AC | PRN
  Administered 2019-12-26 (×2): 1 via ORAL
  Filled 2019-12-26 (×2): qty 1

## 2019-12-26 MED ORDER — MELATONIN 3 MG PO TABS
6.0000 mg | ORAL_TABLET | Freq: Every day | ORAL | Status: DC
  Administered 2019-12-26 – 2019-12-29 (×5): 6 mg via ORAL
  Filled 2019-12-26 (×5): qty 2

## 2019-12-26 MED ORDER — GUAIFENESIN-DM 100-10 MG/5ML PO SYRP
10.0000 mL | ORAL_SOLUTION | ORAL | Status: DC | PRN
  Administered 2019-12-26: 10 mL via ORAL
  Filled 2019-12-26: qty 10

## 2019-12-26 MED ORDER — DIPHENHYDRAMINE HCL 25 MG PO CAPS
25.0000 mg | ORAL_CAPSULE | Freq: Four times a day (QID) | ORAL | Status: DC | PRN
  Administered 2019-12-26 – 2019-12-28 (×2): 25 mg via ORAL
  Filled 2019-12-26 (×2): qty 1

## 2019-12-26 MED ORDER — PROCHLORPERAZINE EDISYLATE 10 MG/2ML IJ SOLN
10.0000 mg | Freq: Four times a day (QID) | INTRAMUSCULAR | Status: AC | PRN
  Administered 2019-12-26 (×2): 10 mg via INTRAVENOUS
  Filled 2019-12-26 (×2): qty 2

## 2019-12-26 NOTE — Progress Notes (Addendum)
PROGRESS NOTE  Seidy Labreck GUY:[id number] DOB: 1981/04/28 DOA: 12/25/2019 PCP: [facility] (Inactive)  Brief History    Amy Heath is a 39 y.o. female with no significant past medical history presents to the ER with complaints of nausea vomiting and diarrhea.  Patient has been having the symptoms for last 3 days.  Also has mid abdominal crampy pain.  Denies any blood in the vomit or diarrhea.  Had subjective feeling of fever chills.  Patient states her son has been having the symptoms for last 2 days.  Denies any recent travel.  Has not had any abnormal food.  Last few days patient has been having productive cough.  ED Course: In the ER patient was hemodynamically stable but has been having recurrent episodes of nausea vomiting and abdominal discomfort.  CT abdomen pelvis does not show anything acute.  Covid test was negative and labs are remarkable for severe hypokalemia with potassium of 2.5 creatinine is increased from baseline to 1.5 and bicarb of 19.  WBC count is 11.  Patient admitted for hydration and replacement of potassium and close observation.  [facility] were consulted to admit the patient for further evaluation and treatment.  The patient was admitted to a telemetry bed. Her potassium was supplement, rechecked and supplemented again. She is receiving IV fluids and antiemetics.  Consultants  . None  Procedures  . None  Antibiotics   Anti-infectives (From admission, onward)   None    .  Subjective  The patient is lying in bed. She states that she is still feeling nauseated and that she had vomited just a short time before my visit. She also states that she is having diarrhea. She is on a full liquid diet. She has not eaten any breakfast.  Objective   Vitals:  Vitals:   12/26/19 0548 12/26/19 1420  BP: (!) 104/57 139/74  Pulse: 71 67  Resp: 17 18  Temp: 97.8 F (36.6 C) 97.7 F (36.5 C)  SpO2: 100% 100%    Exam:  Constitutional:  . The patient is awake, alert, and oriented x 3. No acute distress. Respiratory:  . No increased work of breathing. . No wheezes, rales, or rhonchi . No tactile fremitus Cardiovascular:  . Regular rate and rhythm . No murmurs, ectopy, or gallups. . No lateral PMI. No thrills. Abdomen:  . Abdomen is soft, slightly distended . Tympanic . Non-tender . No hernias, masses, or organomegaly . Hyperactive bowel sounds.  Musculoskeletal:  . No cyanosis, clubbing, or edema Skin:  . No rashes, lesions, ulcers . palpation of skin: no induration or nodules Neurologic:  . CN 2-12 intact . Sensation all 4 extremities intact Psychiatric:  . Mental status o Mood, affect appropriate o Orientation to person, place, time  . judgment and insight appear intact  I have personally reviewed the following:   Today's Data  . BMP, Vitals .  Imaging  . CT abdomen  Scheduled Meds: . enoxaparin (LOVENOX) injection  40 mg Subcutaneous QHS  . melatonin  6 mg Oral QHS   Continuous Infusions: . sodium chloride 75 mL/hr at 12/25/19 2357  . potassium chloride 10 mEq (12/26/19 1341)    Principal Problem:   Nausea & vomiting Active Problems:   Hypokalemia   ARF (acute renal failure) (HCC)  Intractable nausea and vomiting: Etiology unknown. CT abdomen is unremarkable. Enteric pathogen panel is pending. Likely a viral gastroenteritis. Continue IV fluids. Will back up diet to clears. Continue antiemetics.  Hypokalemia:  Severe. Low again this morning. Will supplement and continue to follow. Magnesium is normal.  Diarrhea: Enteric pathogen panel is pending.   AKI: Appears resolved with IV fluids. Avoid nephrotoxic medications and hypotension. Monitor creatinine, electrolytes, and volume status.  I have seen and examined this patient myself. I have spent 35 minutes in her evaluation and care.  DVT prophylaxis: Lovenox CODE STATUS: Full Code Family Communication: None  at bedside Disposition: From home. Anticipate discharge to home. Barrier to discharge: Still vomiting and having diarrhea. Not tolerating diet. The patient is not cleared for discharge.  Patient Status: Inpatient.   LOS: 0 days   Dejion Grillo, DO

## 2019-12-26 NOTE — Plan of Care (Signed)

## 2019-12-26 NOTE — Progress Notes (Signed)
Initial Nutrition Assessment  RD working remotely.  DOCUMENTATION CODES:   Not applicable  INTERVENTION:   -Boost Breeze po TID, each supplement provides 250 kcal and 9 grams of protein -MVI with minerals daily  NUTRITION DIAGNOSIS:   Inadequate oral intake related to altered GI function as evidenced by (clear liquid diet).  GOAL:   Patient will meet greater than or equal to 90% of their needs  MONITOR:   PO intake, Supplement acceptance, Diet advancement, Weight trends, Labs, Skin, I & O's  REASON FOR ASSESSMENT:   Malnutrition Screening Tool    ASSESSMENT:   Amy Heath is a 39 y.o. female with no significant past medical history presents to the ER with complaints of nausea vomiting and diarrhea.  Pt admitted with intractable nausea and vomiting.   Reviewed I/O's: +1.8 L x 24 hours  Attempted to speak with pt via phone, however, no answer.   Per MD notes, CT of abdomen in unremarkable. Awaiting enteric pathogen panel- MD suspects vital gastroenteritis.   Pt currently on a clear liquid diet. No meal intake data to assess at this time.   Reviewed wt hx; wt has been stable over the past few years.   Medications reviewed and include lovenox, melatonin, and 0.9% sodium chloride infusion @ 75 ml/hr.   Labs reviewed: K: 2.8 (on potassium chloride 10 mEq in 100 ml IVPB).   Diet Order:   Diet Order            Diet clear liquid Room service appropriate? Yes; Fluid consistency: Thin  Diet effective now              EDUCATION NEEDS:   No education needs have been identified at this time  Skin:  Skin Assessment: Reviewed RN Assessment  Last BM:  12/26/19  Height:   Ht Readings from Last 1 Encounters:  12/26/19 5\' 6"  (1.676 m)    Weight:   Wt Readings from Last 1 Encounters:  12/26/19 75.3 kg    Ideal Body Weight:  59.1 kg  BMI:  Body mass index is 26.79 kg/m.  Estimated Nutritional Needs:   Kcal:  1900-2100  Protein:  95-110  grams  Fluid:  > 1.9 L    Loistine Chance, RD, LDN, [place] Registered Dietitian II Certified Diabetes Care and Education Specialist Please refer to [facility] for RD and/or RD on-call/weekend/after hours pager

## 2019-12-26 NOTE — Plan of Care (Signed)
  Problem: Education: Goal: Knowledge of General Education information will improve Description: Including pain rating scale, medication(s)/side effects and non-pharmacologic comfort measures Outcome: Progressing   Problem: Health Behavior/Discharge Planning: Goal: Ability to manage health-related needs will improve Outcome: Progressing   Problem: Clinical Measurements: Goal: Ability to maintain clinical measurements within normal limits will improve Outcome: Progressing Goal: Will remain free from infection Outcome: Progressing Goal: Cardiovascular complication will be avoided Outcome: Progressing   Problem: Activity: Goal: Risk for activity intolerance will decrease Outcome: Progressing   Problem: Nutrition: Goal: Adequate nutrition will be maintained Outcome: Progressing   Problem: Pain Managment: Goal: General experience of comfort will improve Outcome: Progressing   Problem: Safety: Goal: Ability to remain free from injury will improve Outcome: Progressing   Problem: Skin Integrity: Goal: Risk for impaired skin integrity will decrease Outcome: Progressing   

## 2019-12-27 DIAGNOSIS — A09 Infectious gastroenteritis and colitis, unspecified: Secondary | ICD-10-CM

## 2019-12-27 DIAGNOSIS — R1030 Lower abdominal pain, unspecified: Secondary | ICD-10-CM

## 2019-12-27 LAB — BASIC METABOLIC PANEL
Anion gap: 13 (ref 5–15)
BUN: 8 mg/dL (ref 6–20)
CO2: 23 mmol/L (ref 22–32)
Calcium: 8.7 mg/dL — ABNORMAL LOW (ref 8.9–10.3)
Chloride: 103 mmol/L (ref 98–111)
Creatinine, Ser: 0.89 mg/dL (ref 0.44–1.00)
GFR calc Af Amer: >60 mL/min (ref >60)
GFR calc non Af Amer: >60 mL/min (ref >60)
Glucose, Bld: 103 mg/dL — ABNORMAL HIGH (ref 70–99)
Potassium: 2.8 mmol/L — ABNORMAL LOW (ref 3.5–5.1)
Sodium: 139 mmol/L (ref 135–145)

## 2019-12-27 LAB — GASTROINTESTINAL PANEL BY PCR, STOOL (REPLACES STOOL CULTURE)

## 2019-12-27 LAB — MAGNESIUM: Magnesium: 1.9 mg/dL (ref 1.7–2.4)

## 2019-12-27 MED ORDER — POTASSIUM CHLORIDE CRYS ER 20 MEQ PO TBCR
40.0000 meq | EXTENDED_RELEASE_TABLET | ORAL | Status: AC
  Administered 2019-12-27 (×2): 40 meq via ORAL
  Filled 2019-12-27 (×2): qty 2

## 2019-12-27 MED ORDER — OXYCODONE-ACETAMINOPHEN 5-325 MG PO TABS
1.0000 | ORAL_TABLET | ORAL | Status: AC | PRN
  Administered 2019-12-27 – 2019-12-28 (×3): 1 via ORAL
  Filled 2019-12-27 (×3): qty 1

## 2019-12-27 MED ORDER — PROCHLORPERAZINE EDISYLATE 10 MG/2ML IJ SOLN
10.0000 mg | Freq: Four times a day (QID) | INTRAMUSCULAR | Status: AC | PRN
  Administered 2019-12-27 (×2): 10 mg via INTRAVENOUS
  Filled 2019-12-27 (×2): qty 2

## 2019-12-27 MED ORDER — AZITHROMYCIN 250 MG PO TABS
500.0000 mg | ORAL_TABLET | Freq: Every day | ORAL | Status: DC
  Administered 2019-12-27: 500 mg via ORAL
  Filled 2019-12-27: qty 2

## 2019-12-27 MED ORDER — PROCHLORPERAZINE EDISYLATE 10 MG/2ML IJ SOLN
10.0000 mg | Freq: Four times a day (QID) | INTRAMUSCULAR | Status: DC | PRN
  Administered 2019-12-27 – 2019-12-30 (×9): 10 mg via INTRAVENOUS
  Filled 2019-12-27 (×9): qty 2

## 2019-12-27 MED ORDER — POTASSIUM CHLORIDE IN NACL 40-0.9 MEQ/L-% IV SOLN
INTRAVENOUS | Status: DC
  Administered 2019-12-27 – 2019-12-28 (×2): 75 mL/h via INTRAVENOUS
  Filled 2019-12-27 (×7): qty 1000

## 2019-12-27 MED ORDER — SODIUM CHLORIDE 0.9 % IV SOLN: INTRAVENOUS | Status: DC

## 2019-12-27 NOTE — Progress Notes (Signed)
Nutrition Follow-up  DOCUMENTATION CODES:   Not applicable  INTERVENTION:   -Continue MVI with minerals daily -Continue Boost Breeze po TID, each supplement provides 250 kcal and 9 grams of protein -RD will follow for diet advancement and adjust supplement regimen as appropriate  NUTRITION DIAGNOSIS:   Inadequate oral intake related to altered GI function as evidenced by (clear liquid diet).  Ongoing  GOAL:   Patient will meet greater than or equal to 90% of their needs  Progressing   MONITOR:   PO intake, Supplement acceptance, Diet advancement, Weight trends, Labs, Skin, I & O's  REASON FOR ASSESSMENT:   Malnutrition Screening Tool    ASSESSMENT:   Amy Heath is a 39 y.o. female with no significant past medical history presents to the ER with complaints of nausea vomiting and diarrhea.  Reviewed I/O's: +3.1 L x 24 hours and +4.9 L since admission  Emesis: 400 ml x 24 hours  Pt receiving nursing care at time of visit. Per discussion with RN, pt still having nausea, but tolerating clear liquid diet. She has been accepting Boost Breeze supplements.   Enteric pathogen panel pending.   Medications reviewed and include 0.9% NaCl with KCl 40 mEq/L infusion @ 75 ml/hr.   Labs reviewed: K: 3.3 (on PO supplementation). Mg WDL.   NUTRITION - FOCUSED PHYSICAL EXAM:    Most Recent Value  Orbital Region  No depletion  Upper Arm Region  No depletion  Thoracic and Lumbar Region  No depletion  Buccal Region  No depletion  Temple Region  No depletion  Clavicle Bone Region  No depletion  Clavicle and Acromion Bone Region  No depletion  Scapular Bone Region  No depletion  Dorsal Hand  No depletion  Patellar Region  No depletion  Anterior Thigh Region  No depletion  Posterior Calf Region  No depletion  Hair  Reviewed  Eyes  Reviewed  Mouth  Reviewed  Skin  Reviewed  Nails  Reviewed       Diet Order:   Diet Order            Diet clear liquid Room service  appropriate? Yes; Fluid consistency: Thin  Diet effective now              EDUCATION NEEDS:   No education needs have been identified at this time  Skin:  Skin Assessment: Reviewed RN Assessment  Last BM:  12/26/19  Height:   Ht Readings from Last 1 Encounters:  12/26/19 5\' 6"  (1.676 m)    Weight:   Wt Readings from Last 1 Encounters:  12/27/19 78.1 kg    Ideal Body Weight:  59.1 kg  BMI:  Body mass index is 27.79 kg/m.  Estimated Nutritional Needs:   Kcal:  1900-2100  Protein:  95-110 grams  Fluid:  > 1.9 L    Loistine Chance, RD, LDN, [place] Registered Dietitian II Certified Diabetes Care and Education Specialist Please refer to [facility] for RD and/or RD on-call/weekend/after hours pager

## 2019-12-27 NOTE — Progress Notes (Signed)
PROGRESS NOTE  Amy Heath FFM:[id number] DOB: 22-Jan-1981   PCP: [facility] (Inactive)  Patient is from: Home.  DOA: 12/25/2019 LOS: 1  Brief Narrative / Interim history: 39 year old female with no PMH admitted with intractable nausea, vomiting and diarrhea for 3 days.  Had associated abdominal pain.  CT abdomen and pelvis without acute finding.  COVID-19 PCR negative.  Has significant hypokalemia and AKI on presentation.  Admitted for intractable nausea/vomiting/diarrhea, AKI, hypokalemia and metabolic acidosis likely due to gastroenteritis.  Subjective: Seen and examined earlier this morning.  Continues to endorse significant diarrhea and vomiting.  She reports 2 episodes of watery diarrhea and 4 episodes of emesis since last night.  Denies blood in the stool or emesis.  Also reports low abdominal pain that she describes as aching and severe.  Objective: Vitals:   12/26/19 1420 12/26/19 1950 12/27/19 0434 12/27/19 0457  BP: 139/74 (!) 156/86 (!) 146/90   Pulse: 67 64 65   Resp: 18 17 18    Temp: 97.7 F (36.5 C) 98.2 F (36.8 C) 97.9 F (36.6 C)   TempSrc: Oral Oral Oral   SpO2: 100% 100% 100%   Weight:    78.1 kg  Height:        Intake/Output Summary (Last 24 hours) at 12/27/2019 1359 Last data filed at 12/27/2019 1312 Gross per 24 hour  Intake 3429.88 ml  Output 400 ml  Net 3029.88 ml   Filed Weights   12/26/19 0000 12/27/19 0457  Weight: 75.3 kg 78.1 kg    Examination:  GENERAL: No apparent distress.  Nontoxic. HEENT: MMM.  Vision and hearing grossly intact.  NECK: Supple.  No apparent JVD.  RESP: On room air.  No IWOB.  Fair aeration bilaterally. CVS:  RRR. Heart sounds normal.  ABD/GI/GU: BS+. Abd soft.  Tender to palpation.  No rebound or guarding MSK/EXT:  Moves extremities. No apparent deformity. No edema.  SKIN: no apparent skin lesion or wound NEURO: Awake, alert and oriented appropriately.  No apparent focal neuro  deficit. PSYCH: Calm. Normal affect.  Procedures:  None  Microbiology summarized: COVID-19 PCR negative. GIP pending.  Assessment & Plan: Intractable nausea/vomiting/diarrhea/abdominal pain-suspect gastroenteritis.  CT abdomen and pelvis without significant finding.  Low risk for C. Difficile but GI panel pending. -Continue IV fluid -Follow GI panel -Start azithromycin to cover bacterial etiology pending GIP  Hypokalemia: Likely due to GI loss.  Magnesium within normal. -Replenish and recheck   AKI: Resolved. -IV fluid as above  Nutrition Problem: Inadequate oral intake Etiology: altered GI function Signs/Symptoms: (clear liquid diet) Interventions: Boost Breeze, MVI   DVT prophylaxis: Subcu Lovenox Code Status: Full code Family Communication: Patient and/or RN. Available if any question.  Status is: Inpatient  Remains inpatient appropriate because:Persistent severe electrolyte disturbances, IV treatments appropriate due to intensity of illness or inability to take PO and Inpatient level of care appropriate due to severity of illness   Dispo: The patient is from: Home              Anticipated d/c is to: Home              Anticipated d/c date is: 2 days              Patient currently is not medically stable to d/c.       Consultants:  None   Sch Meds:  Scheduled Meds: . enoxaparin (LOVENOX) injection  40 mg Subcutaneous QHS  . feeding supplement  1 Container  Oral TID BM  . melatonin  6 mg Oral QHS  . potassium chloride  40 mEq Oral Q4H   Continuous Infusions: . 0.9 % NaCl with KCl 40 mEq / L 75 mL/hr (12/27/19 1310)   PRN Meds:.albuterol, diphenhydrAMINE, fentaNYL (SUBLIMAZE) injection, guaiFENesin-dextromethorphan, ondansetron **OR** ondansetron (ZOFRAN) IV, prochlorperazine  Antimicrobials: Anti-infectives (From admission, onward)   None       I have personally reviewed the following labs and images: CBC: Recent Labs  Lab 12/25/19 1331  12/26/19 0143  WBC 11.0* 11.1*  NEUTROABS  --  6.3  HGB 14.8 12.4  HCT 42.6 36.7  MCV 81.6 84.4  PLT 412* 332   BMP &GFR Recent Labs  Lab 12/25/19 1331 12/26/19 0143 12/26/19 0934 12/27/19 0946  NA 134* 138 138 139  K 2.5* 2.8* 3.3* 2.8*  CL 98 103 107 103  CO2 19* 23 19* 23  GLUCOSE 133* 112* 106* 103*  BUN 22* 14 14 8   CREATININE 1.50* 0.97 0.95 0.89  CALCIUM 9.5 8.3* 7.9* 8.7*  MG  --  2.0 1.9 1.9   Estimated Creatinine Clearance: 89.5 mL/min (by C-G formula based on SCr of 0.89 mg/dL). Liver & Pancreas: Recent Labs  Lab 12/25/19 1331 12/26/19 0143  AST 28 23  ALT 23 19  ALKPHOS 89 71  BILITOT 0.7 0.5  PROT 9.1* 6.9  ALBUMIN 4.6 3.5   Recent Labs  Lab 12/25/19 1331  LIPASE 33   No results for input(s): AMMONIA in the last 168 hours. Diabetic: No results for input(s): HGBA1C in the last 72 hours. No results for input(s): GLUCAP in the last 168 hours. Cardiac Enzymes: No results for input(s): CKTOTAL, CKMB, CKMBINDEX, TROPONINI in the last 168 hours. No results for input(s): PROBNP in the last 8760 hours. Coagulation Profile: No results for input(s): INR, PROTIME in the last 168 hours. Thyroid Function Tests: No results for input(s): TSH, T4TOTAL, FREET4, T3FREE, THYROIDAB in the last 72 hours. Lipid Profile: No results for input(s): CHOL, HDL, LDLCALC, TRIG, CHOLHDL, LDLDIRECT in the last 72 hours. Anemia Panel: No results for input(s): VITAMINB12, FOLATE, FERRITIN, TIBC, IRON, RETICCTPCT in the last 72 hours. Urine analysis:    Component Value Date/Time   COLORURINE RED (A) 12/25/2019 1554   APPEARANCEUR TURBID (A) 12/25/2019 1554   LABSPEC  12/25/2019 1554    TEST NOT REPORTED DUE TO COLOR INTERFERENCE OF URINE PIGMENT   PHURINE  12/25/2019 1554    TEST NOT REPORTED DUE TO COLOR INTERFERENCE OF URINE PIGMENT   GLUCOSEU (A) 12/25/2019 1554    TEST NOT REPORTED DUE TO COLOR INTERFERENCE OF URINE PIGMENT   HGBUR (A) 12/25/2019 1554    TEST NOT  REPORTED DUE TO COLOR INTERFERENCE OF URINE PIGMENT   BILIRUBINUR (A) 12/25/2019 1554    TEST NOT REPORTED DUE TO COLOR INTERFERENCE OF URINE PIGMENT   KETONESUR (A) 12/25/2019 1554    TEST NOT REPORTED DUE TO COLOR INTERFERENCE OF URINE PIGMENT   PROTEINUR (A) 12/25/2019 1554    TEST NOT REPORTED DUE TO COLOR INTERFERENCE OF URINE PIGMENT   UROBILINOGEN 1.0 09/21/2014 1200   NITRITE (A) 12/25/2019 1554    TEST NOT REPORTED DUE TO COLOR INTERFERENCE OF URINE PIGMENT   LEUKOCYTESUR (A) 12/25/2019 1554    TEST NOT REPORTED DUE TO COLOR INTERFERENCE OF URINE PIGMENT   Sepsis Labs: Invalid input(s): PROCALCITONIN, [place]  Microbiology: Recent Results (from the past 240 hour(s))  SARS Coronavirus 2 by RT PCR (hospital order, performed in [facility] hospital  lab) Nasopharyngeal Nasopharyngeal Swab     Status: None   Collection Time: 12/25/19  8:14 PM   Specimen: Nasopharyngeal Swab  Result Value Ref Range Status   SARS Coronavirus 2 NEGATIVE NEGATIVE Final    Comment: (NOTE) SARS-CoV-2 target nucleic acids are NOT DETECTED. The SARS-CoV-2 RNA is generally detectable in upper and lower respiratory specimens during the acute phase of infection. The lowest concentration of SARS-CoV-2 viral copies this assay can detect is 250 copies / mL. A negative result does not preclude SARS-CoV-2 infection and should not be used as the sole basis for treatment or other patient management decisions.  A negative result may occur with improper specimen collection / handling, submission of specimen other than nasopharyngeal swab, presence of viral mutation(s) within the areas targeted by this assay, and inadequate number of viral copies (<250 copies / mL). A negative result must be combined with clinical observations, patient history, and epidemiological information. Fact Sheet for Patients:   StrictlyIdeas.no Fact Sheet for Healthcare  Providers: BankingDealers.co.za This test is not yet approved or cleared  by the [place] FDA and has been authorized for detection and/or diagnosis of SARS-CoV-2 by FDA under an Emergency Use Authorization (EUA).  This EUA will remain in effect (meaning this test can be used) for the duration of the COVID-19 declaration under Section 564(b)(1) of the Act, 21 U.S.C. section 360bbb-3(b)(1), unless the authorization is terminated or revoked sooner. Performed at [facility], [place] [address] [place], [place] [address]     Radiology Studies: No results found.    Barnard Sharps T. [place]  If 7PM-7AM, please contact night-coverage [email or web address] Password [email or web address] 12/27/2019, 1:59 PM

## 2019-12-28 DIAGNOSIS — A0811 Acute gastroenteropathy due to Norwalk agent: Secondary | ICD-10-CM

## 2019-12-28 DIAGNOSIS — A084 Viral intestinal infection, unspecified: Principal | ICD-10-CM

## 2019-12-28 LAB — RENAL FUNCTION PANEL
Albumin: 3.3 g/dL — ABNORMAL LOW (ref 3.5–5.0)
Anion gap: 11 (ref 5–15)
BUN: 6 mg/dL (ref 6–20)
CO2: 25 mmol/L (ref 22–32)
Calcium: 8.7 mg/dL — ABNORMAL LOW (ref 8.9–10.3)
Chloride: 104 mmol/L (ref 98–111)
Creatinine, Ser: 0.86 mg/dL (ref 0.44–1.00)
GFR calc Af Amer: >60 mL/min (ref >60)
GFR calc non Af Amer: >60 mL/min (ref >60)
Glucose, Bld: 93 mg/dL (ref 70–99)
Phosphorus: 3.2 mg/dL (ref 2.5–4.6)
Potassium: 3.2 mmol/L — ABNORMAL LOW (ref 3.5–5.1)
Sodium: 140 mmol/L (ref 135–145)

## 2019-12-28 LAB — MAGNESIUM: Magnesium: 1.8 mg/dL (ref 1.7–2.4)

## 2019-12-28 MED ORDER — POTASSIUM CHLORIDE CRYS ER 20 MEQ PO TBCR
40.0000 meq | EXTENDED_RELEASE_TABLET | ORAL | Status: AC
  Administered 2019-12-28 (×2): 40 meq via ORAL
  Filled 2019-12-28 (×2): qty 2

## 2019-12-28 NOTE — Progress Notes (Signed)
PROGRESS NOTE  Amy Heath WJX:[id number] DOB: [birth year]   PCP: [facility] (Inactive)  Patient is from: Home.  DOA: 12/25/2019 LOS: 2  Brief Narrative / Interim history: 39 year old female with no PMH admitted with intractable nausea, vomiting and diarrhea for 3 days.  Had associated abdominal pain.  CT abdomen and pelvis without acute finding.  COVID-19 PCR negative.  Has significant hypokalemia and AKI on presentation.  Admitted for intractable nausea/vomiting/diarrhea, AKI, hypokalemia and metabolic acidosis due to viral gastroenteritis.  GIP positive for norovirus.  Continues to have intractable emesis and diarrhea requiring IV fluid and aggressive electrolyte replenishment.  Subjective: Seen and examined earlier this morning.  Reports about 5 episodes of watery diarrhea and 3 episodes of emesis.  Emesis and diarrhea are nonbloody.  She also reports feeling lightheaded when she got up to go to the bathroom.  Endorses abdominal pain.  Denies chest pain or dyspnea.  Objective: Vitals:   12/27/19 0457 12/27/19 2100 12/28/19 0500 12/28/19 1336  BP:  115/61 (!) 151/81 113/66  Pulse:  64 62 79  Resp:  17 18 18   Temp:  98.4 F (36.9 C) 98.1 F (36.7 C) 98.2 F (36.8 C)  TempSrc:  Oral Oral Oral  SpO2:  100% 100% 100%  Weight: 78.1 kg  81.1 kg   Height:        Intake/Output Summary (Last 24 hours) at 12/28/2019 1504 Last data filed at 12/28/2019 0900 Gross per 24 hour  Intake 2204.71 ml  Output 700 ml  Net 1504.71 ml   Filed Weights   12/26/19 0000 12/27/19 0457 12/28/19 0500  Weight: 75.3 kg 78.1 kg 81.1 kg    Examination:  GENERAL: No apparent distress.  Nontoxic. HEENT: MMM.  Vision and hearing grossly intact.  NECK: Supple.  No apparent JVD.  RESP:  No IWOB.  Fair aeration bilaterally. CVS:  RRR. Heart sounds normal.  ABD/GI/GU: BS+. Abd soft.  Diffuse tenderness to palpation. MSK/EXT:  Moves extremities. No apparent deformity. No  edema.  SKIN: no apparent skin lesion or wound NEURO: Awake, alert and oriented appropriately.  No apparent focal neuro deficit. PSYCH: Calm. Normal affect.  Procedures:  None  Microbiology summarized: COVID-19 PCR negative. GIP positive for norovirus  Assessment & Plan: Intractable nausea/vomiting/diarrhea/abdominal pain-due to viral gastroenteritis.  GIP positive for norovirus.  CT abdomen and pelvis without significant finding.  Still with multiple episodes of diarrhea and vomiting -Continue supportive care with IV fluid, antiemetics and electrolyte replenishment.  Hypokalemia: Likely due to GI loss.  Magnesium within normal. -Replenish and recheck   AKI: Resolved. -IV fluid as above  Nutrition Problem: Inadequate oral intake Etiology: altered GI function Signs/Symptoms:  (clear liquid diet) Interventions: Boost Breeze, MVI   DVT prophylaxis: Subcu Lovenox Code Status: Full code Family Communication: Patient and/or RN. Available if any question.  Status is: Inpatient  Remains inpatient appropriate because:Persistent severe electrolyte disturbances, IV treatments appropriate due to intensity of illness or inability to take PO and Inpatient level of care appropriate due to severity of illness   Dispo: The patient is from: Home              Anticipated d/c is to: Home              Anticipated d/c date is: 1 day              Patient currently is not medically stable to d/c.       Consultants:  None  Sch Meds:  Scheduled Meds: . enoxaparin (LOVENOX) injection  40 mg Subcutaneous QHS  . feeding supplement  1 Container Oral TID BM  . melatonin  6 mg Oral QHS   Continuous Infusions: . 0.9 % NaCl with KCl 40 mEq / L 75 mL/hr (12/28/19 0115)   PRN Meds:.albuterol, diphenhydrAMINE, fentaNYL (SUBLIMAZE) injection, guaiFENesin-dextromethorphan, ondansetron **OR** ondansetron (ZOFRAN) IV, oxyCODONE-acetaminophen, prochlorperazine  Antimicrobials: Anti-infectives  (From admission, onward)   Start     Dose/Rate Route Frequency Ordered Stop   12/27/19 1415  azithromycin (ZITHROMAX) tablet 500 mg  Status:  Discontinued        500 mg Oral Daily 12/27/19 1400 12/27/19 1549       I have personally reviewed the following labs and images: CBC: Recent Labs  Lab 12/25/19 1331 12/26/19 0143  WBC 11.0* 11.1*  NEUTROABS  --  6.3  HGB 14.8 12.4  HCT 42.6 36.7  MCV 81.6 84.4  PLT 412* 332   BMP &GFR Recent Labs  Lab 12/25/19 1331 12/26/19 0143 12/26/19 0934 12/27/19 0946 12/28/19 0229  NA 134* 138 138 139 140  K 2.5* 2.8* 3.3* 2.8* 3.2*  CL 98 103 107 103 104  CO2 19* 23 19* 23 25  GLUCOSE 133* 112* 106* 103* 93  BUN 22* 14 14 8 6   CREATININE 1.50* 0.97 0.95 0.89 0.86  CALCIUM 9.5 8.3* 7.9* 8.7* 8.7*  MG  --  2.0 1.9 1.9 1.8  PHOS  --   --   --   --  3.2   Estimated Creatinine Clearance: 94.3 mL/min (by C-G formula based on SCr of 0.86 mg/dL). Liver & Pancreas: Recent Labs  Lab 12/25/19 1331 12/26/19 0143 12/28/19 0229  AST 28 23  --   ALT 23 19  --   ALKPHOS 89 71  --   BILITOT 0.7 0.5  --   PROT 9.1* 6.9  --   ALBUMIN 4.6 3.5 3.3*   Recent Labs  Lab 12/25/19 1331  LIPASE 33   No results for input(s): AMMONIA in the last 168 hours. Diabetic: No results for input(s): HGBA1C in the last 72 hours. No results for input(s): GLUCAP in the last 168 hours. Cardiac Enzymes: No results for input(s): CKTOTAL, CKMB, CKMBINDEX, TROPONINI in the last 168 hours. No results for input(s): PROBNP in the last 8760 hours. Coagulation Profile: No results for input(s): INR, PROTIME in the last 168 hours. Thyroid Function Tests: No results for input(s): TSH, T4TOTAL, FREET4, T3FREE, THYROIDAB in the last 72 hours. Lipid Profile: No results for input(s): CHOL, HDL, LDLCALC, TRIG, CHOLHDL, LDLDIRECT in the last 72 hours. Anemia Panel: No results for input(s): VITAMINB12, FOLATE, FERRITIN, TIBC, IRON, RETICCTPCT in the last 72 hours. Urine  analysis:    Component Value Date/Time   COLORURINE RED (A) 12/25/2019 1554   APPEARANCEUR TURBID (A) 12/25/2019 1554   LABSPEC  12/25/2019 1554    TEST NOT REPORTED DUE TO COLOR INTERFERENCE OF URINE PIGMENT   PHURINE  12/25/2019 1554    TEST NOT REPORTED DUE TO COLOR INTERFERENCE OF URINE PIGMENT   GLUCOSEU (A) 12/25/2019 1554    TEST NOT REPORTED DUE TO COLOR INTERFERENCE OF URINE PIGMENT   HGBUR (A) 12/25/2019 1554    TEST NOT REPORTED DUE TO COLOR INTERFERENCE OF URINE PIGMENT   BILIRUBINUR (A) 12/25/2019 1554    TEST NOT REPORTED DUE TO COLOR INTERFERENCE OF URINE PIGMENT   KETONESUR (A) 12/25/2019 1554    TEST NOT REPORTED DUE TO COLOR INTERFERENCE OF URINE  PIGMENT   PROTEINUR (A) 12/25/2019 1554    TEST NOT REPORTED DUE TO COLOR INTERFERENCE OF URINE PIGMENT   UROBILINOGEN 1.0 09/21/2014 1200   NITRITE (A) 12/25/2019 1554    TEST NOT REPORTED DUE TO COLOR INTERFERENCE OF URINE PIGMENT   LEUKOCYTESUR (A) 12/25/2019 1554    TEST NOT REPORTED DUE TO COLOR INTERFERENCE OF URINE PIGMENT   Sepsis Labs: Invalid input(s): PROCALCITONIN, [place]  Microbiology: Recent Results (from the past 240 hour(s))  SARS Coronavirus 2 by RT PCR (hospital order, performed in [facility] hospital lab) Nasopharyngeal Nasopharyngeal Swab     Status: None   Collection Time: 12/25/19  8:14 PM   Specimen: Nasopharyngeal Swab  Result Value Ref Range Status   SARS Coronavirus 2 NEGATIVE NEGATIVE Final    Comment: (NOTE) SARS-CoV-2 target nucleic acids are NOT DETECTED. The SARS-CoV-2 RNA is generally detectable in upper and lower respiratory specimens during the acute phase of infection. The lowest concentration of SARS-CoV-2 viral copies this assay can detect is 250 copies / mL. A negative result does not preclude SARS-CoV-2 infection and should not be used as the sole basis for treatment or other patient management decisions.  A negative result may occur with improper specimen collection /  handling, submission of specimen other than nasopharyngeal swab, presence of viral mutation(s) within the areas targeted by this assay, and inadequate number of viral copies (<250 copies / mL). A negative result must be combined with clinical observations, patient history, and epidemiological information. Fact Sheet for Patients:   StrictlyIdeas.no Fact Sheet for Healthcare Providers: BankingDealers.co.za This test is not yet approved or cleared  by the [place] FDA and has been authorized for detection and/or diagnosis of SARS-CoV-2 by FDA under an Emergency Use Authorization (EUA).  This EUA will remain in effect (meaning this test can be used) for the duration of the COVID-19 declaration under Section 564(b)(1) of the Act, 21 U.S.C. section 360bbb-3(b)(1), unless the authorization is terminated or revoked sooner. Performed at [facility], [place] [address] [place], [place] [address]   Gastrointestinal Panel by PCR , Stool     Status: Abnormal   Collection Time: 12/27/19  2:30 AM   Specimen: Stool  Result Value Ref Range Status   Campylobacter species NOT DETECTED NOT DETECTED Final   Plesimonas shigelloides NOT DETECTED NOT DETECTED Final   Salmonella species NOT DETECTED NOT DETECTED Final   Yersinia enterocolitica NOT DETECTED NOT DETECTED Final   Vibrio species NOT DETECTED NOT DETECTED Final   Vibrio cholerae NOT DETECTED NOT DETECTED Final   Enteroaggregative E coli (EAEC) NOT DETECTED NOT DETECTED Final   Enteropathogenic E coli (EPEC) NOT DETECTED NOT DETECTED Final   Enterotoxigenic E coli (ETEC) NOT DETECTED NOT DETECTED Final   Shiga like toxin producing E coli (STEC) NOT DETECTED NOT DETECTED Final   Shigella/Enteroinvasive E coli (EIEC) NOT DETECTED NOT DETECTED Final   Cryptosporidium NOT DETECTED NOT DETECTED Final   Cyclospora cayetanensis NOT DETECTED NOT DETECTED Final   Entamoeba histolytica NOT DETECTED  NOT DETECTED Final   Giardia lamblia NOT DETECTED NOT DETECTED Final   Adenovirus F40/41 NOT DETECTED NOT DETECTED Final   Astrovirus NOT DETECTED NOT DETECTED Final   Norovirus GI/GII DETECTED (A) NOT DETECTED Final    Comment: RESULT CALLED TO, READ BACK BY AND VERIFIED WITH: ZENAIDA CALWITAN RN AT 5427 ON 12/27/19 SNG     Rotavirus A NOT DETECTED NOT DETECTED Final   Sapovirus (I, II, IV, and V) NOT  DETECTED NOT DETECTED Final    Comment: Performed at [facility], [address] [place], [place] [address]    Radiology Studies: No results found.    Ita Fritzsche T. Van Horne  If 7PM-7AM, please contact night-coverage [email or web address] Password [facility] 12/28/2019, 3:04 PM

## 2019-12-29 DIAGNOSIS — D649 Anemia, unspecified: Secondary | ICD-10-CM

## 2019-12-29 LAB — RENAL FUNCTION PANEL
Albumin: 2.7 g/dL — ABNORMAL LOW (ref 3.5–5.0)
Anion gap: 9 (ref 5–15)
BUN: 5 mg/dL — ABNORMAL LOW (ref 6–20)
CO2: 25 mmol/L (ref 22–32)
Calcium: 8.5 mg/dL — ABNORMAL LOW (ref 8.9–10.3)
Chloride: 106 mmol/L (ref 98–111)
Creatinine, Ser: 0.82 mg/dL (ref 0.44–1.00)
GFR calc Af Amer: >60 mL/min (ref >60)
GFR calc non Af Amer: >60 mL/min (ref >60)
Glucose, Bld: 97 mg/dL (ref 70–99)
Phosphorus: 3.6 mg/dL (ref 2.5–4.6)
Potassium: 3.2 mmol/L — ABNORMAL LOW (ref 3.5–5.1)
Sodium: 140 mmol/L (ref 135–145)

## 2019-12-29 LAB — CBC
HCT: 31.6 % — ABNORMAL LOW (ref 36.0–46.0)
Hemoglobin: 10.5 g/dL — ABNORMAL LOW (ref 12.0–15.0)
MCH: 28.6 pg (ref 26.0–34.0)
MCHC: 33.2 g/dL (ref 30.0–36.0)
MCV: 86.1 fL (ref 80.0–100.0)
Platelets: 283 10*3/uL (ref 150–400)
RBC: 3.67 MIL/uL — ABNORMAL LOW (ref 3.87–5.11)
RDW: 13.5 % (ref 11.5–15.5)
WBC: 8.2 10*3/uL (ref 4.0–10.5)
nRBC: 0 % (ref 0.0–0.2)

## 2019-12-29 LAB — MAGNESIUM: Magnesium: 1.7 mg/dL (ref 1.7–2.4)

## 2019-12-29 MED ORDER — MAGNESIUM SULFATE 2 GM/50ML IV SOLN
2.0000 g | Freq: Once | INTRAVENOUS | Status: AC
  Administered 2019-12-29: 2 g via INTRAVENOUS
  Filled 2019-12-29: qty 50

## 2019-12-29 MED ORDER — POTASSIUM CHLORIDE CRYS ER 20 MEQ PO TBCR
40.0000 meq | EXTENDED_RELEASE_TABLET | ORAL | Status: AC
  Administered 2019-12-29 (×2): 40 meq via ORAL
  Filled 2019-12-29 (×2): qty 2

## 2019-12-29 MED ORDER — DICYCLOMINE HCL 20 MG PO TABS
20.0000 mg | ORAL_TABLET | Freq: Three times a day (TID) | ORAL | Status: DC
  Administered 2019-12-29 – 2019-12-30 (×4): 20 mg via ORAL
  Filled 2019-12-29 (×5): qty 1

## 2019-12-29 MED ORDER — ACETAMINOPHEN 500 MG PO TABS
1000.0000 mg | ORAL_TABLET | Freq: Three times a day (TID) | ORAL | Status: DC
  Administered 2019-12-29 – 2019-12-30 (×3): 1000 mg via ORAL
  Filled 2019-12-29 (×3): qty 2

## 2019-12-29 MED ORDER — PHENOL 1.4 % MT LIQD
1.0000 | OROMUCOSAL | Status: DC | PRN
  Administered 2019-12-30: 1 via OROMUCOSAL
  Filled 2019-12-29: qty 177

## 2019-12-29 MED ORDER — HYDROCODONE-ACETAMINOPHEN 7.5-325 MG PO TABS
1.0000 | ORAL_TABLET | Freq: Four times a day (QID) | ORAL | Status: AC | PRN
  Administered 2019-12-29: 1 via ORAL
  Filled 2019-12-29: qty 1

## 2019-12-29 MED ORDER — PROCHLORPERAZINE EDISYLATE 10 MG/2ML IJ SOLN: 10.0000 mg | Freq: Four times a day (QID) | INTRAMUSCULAR | Status: DC | PRN

## 2019-12-29 MED ORDER — FENTANYL CITRATE (PF) 100 MCG/2ML IJ SOLN
25.0000 ug | Freq: Once | INTRAMUSCULAR | Status: AC
  Administered 2019-12-29: 25 ug via INTRAVENOUS
  Filled 2019-12-29: qty 2

## 2019-12-29 NOTE — Progress Notes (Signed)
[facility] notified that pain and Nausea medications are not effective in managing pain and nausea also requested a k-pad for back. Arthor Captain LPN

## 2019-12-29 NOTE — Plan of Care (Signed)
  Problem: Clinical Measurements: Goal: Ability to maintain clinical measurements within normal limits will improve Outcome: Progressing Goal: Will remain free from infection Outcome: Progressing Goal: Diagnostic test results will improve Outcome: Progressing Goal: Respiratory complications will improve Outcome: Progressing Goal: Cardiovascular complication will be avoided Outcome: Progressing   Problem: Nutrition: Goal: Adequate nutrition will be maintained Outcome: Progressing   Problem: Coping: Goal: Level of anxiety will decrease Outcome: Progressing   Problem: Pain Managment: Goal: General experience of comfort will improve Outcome: Progressing   Problem: Safety: Goal: Ability to remain free from injury will improve Outcome: Progressing   Problem: Skin Integrity: Goal: Risk for impaired skin integrity will decrease Outcome: Progressing   

## 2019-12-29 NOTE — Progress Notes (Signed)
PROGRESS NOTE  [place] Droll AXK:[id number] DOB: [birth year]   PCP: [facility] (Inactive)  Patient is from: Home.  DOA: 12/25/2019 LOS: 3  Brief Narrative / Interim history: 39 year old female with no PMH admitted with intractable nausea, vomiting and diarrhea for 3 days.  Had associated abdominal pain.  CT abdomen and pelvis without acute finding.  COVID-19 PCR negative.  Has significant hypokalemia and AKI on presentation.  Admitted for intractable nausea/vomiting/diarrhea, AKI, hypokalemia and metabolic acidosis due to viral gastroenteritis.  GIP positive for norovirus.  Continues to have intractable emesis and diarrhea requiring IV fluid and aggressive electrolyte replenishment.  Subjective: Seen and examined earlier this morning.  Continues to endorse emesis and diarrhea.  She reported 3 episodes of emesis and diarrhea in the last 24 hours.  Emesis and diarrhea are nonbloody.  Describes the diarrhea as watery.  Also reports abdominal pain.  Denies chest pain or dyspnea.  Objective: Vitals:   12/28/19 0500 12/28/19 1336 12/28/19 2003 12/29/19 0612  BP: (!) 151/81 113/66 110/64 112/64  Pulse: 62 79 71 89  Resp: 18 18 18 17   Temp: 98.1 F (36.7 C) 98.2 F (36.8 C) 98.4 F (36.9 C) 97.9 F (36.6 C)  TempSrc: Oral Oral Oral Oral  SpO2: 100% 100% 100% 100%  Weight: 81.1 kg     Height:        Intake/Output Summary (Last 24 hours) at 12/29/2019 1422 Last data filed at 12/29/2019 0900 Gross per 24 hour  Intake 960 ml  Output 501 ml  Net 459 ml   Filed Weights   12/26/19 0000 12/27/19 0457 12/28/19 0500  Weight: 75.3 kg 78.1 kg 81.1 kg    Examination:  GENERAL: No apparent distress.  Nontoxic. HEENT: MMM.  Vision and hearing grossly intact.  NECK: Supple.  No apparent JVD.  RESP:  No IWOB.  Fair aeration bilaterally. CVS:  RRR. Heart sounds normal.  ABD/GI/GU: BS+. Abd soft.  Diffuse tenderness to palpation. MSK/EXT:  Moves extremities. No  apparent deformity. No edema.  SKIN: no apparent skin lesion or wound NEURO: Awake, alert and oriented appropriately.  No apparent focal neuro deficit. PSYCH: Calm. Normal affect.   Procedures:  None  Microbiology summarized: COVID-19 PCR negative. GIP positive for norovirus  Assessment & Plan: Intractable nausea/vomiting/diarrhea/abdominal pain-due to viral gastroenteritis.  GIP positive for norovirus.  CT abdomen and pelvis without significant finding.  Still with multiple episodes of diarrhea and vomiting -Continue supportive care with IV fluid, antiemetics and electrolyte replenishment. -Scheduled Tylenol with as needed fentanyl -Add Bentyl  Hypokalemia/hypomagnesemia -Replenish and recheck   AKI: Resolved. -IV fluid as above  Normocytic anemia: Hgb dropped 2 g since admission.  Suspect hemodilution from IV fluid versus GI bleed. -Continue monitoring.  Nutrition Problem: Inadequate oral intake Etiology: altered GI function Signs/Symptoms:  (clear liquid diet) Interventions: Boost Breeze, MVI   DVT prophylaxis: Subcu Lovenox Code Status: Full code Family Communication: Patient and/or RN. Available if any question.  Status is: Inpatient  Remains inpatient appropriate because:Persistent severe electrolyte disturbances, IV treatments appropriate due to intensity of illness or inability to take PO and Inpatient level of care appropriate due to severity of illness   Dispo: The patient is from: Home              Anticipated d/c is to: Home              Anticipated d/c date is: 1 day  Patient currently is not medically stable to d/c.       Consultants:  None   Sch Meds:  Scheduled Meds: . acetaminophen  1,000 mg Oral Q8H  . dicyclomine  20 mg Oral TID AC & HS  . enoxaparin (LOVENOX) injection  40 mg Subcutaneous QHS  . feeding supplement  1 Container Oral TID BM  . melatonin  6 mg Oral QHS   Continuous Infusions: . 0.9 % NaCl with KCl 40 mEq / L  75 mL/hr at 12/29/19 0251   PRN Meds:.albuterol, diphenhydrAMINE, fentaNYL (SUBLIMAZE) injection, guaiFENesin-dextromethorphan, ondansetron **OR** ondansetron (ZOFRAN) IV, prochlorperazine  Antimicrobials: Anti-infectives (From admission, onward)   Start     Dose/Rate Route Frequency Ordered Stop   12/27/19 1415  azithromycin (ZITHROMAX) tablet 500 mg  Status:  Discontinued        500 mg Oral Daily 12/27/19 1400 12/27/19 1549       I have personally reviewed the following labs and images: CBC: Recent Labs  Lab 12/25/19 1331 12/26/19 0143 12/29/19 0303  WBC 11.0* 11.1* 8.2  NEUTROABS  --  6.3  --   HGB 14.8 12.4 10.5*  HCT 42.6 36.7 31.6*  MCV 81.6 84.4 86.1  PLT 412* 332 283   BMP &GFR Recent Labs  Lab 12/26/19 0143 12/26/19 0934 12/27/19 0946 12/28/19 0229 12/29/19 0303  NA 138 138 139 140 140  K 2.8* 3.3* 2.8* 3.2* 3.2*  CL 103 107 103 104 106  CO2 23 19* 23 25 25   GLUCOSE 112* 106* 103* 93 97  BUN 14 14 8 6  5*  CREATININE 0.97 0.95 0.89 0.86 0.82  CALCIUM 8.3* 7.9* 8.7* 8.7* 8.5*  MG 2.0 1.9 1.9 1.8 1.7  PHOS  --   --   --  3.2 3.6   Estimated Creatinine Clearance: 98.9 mL/min (by C-G formula based on SCr of 0.82 mg/dL). Liver & Pancreas: Recent Labs  Lab 12/25/19 1331 12/26/19 0143 12/28/19 0229 12/29/19 0303  AST 28 23  --   --   ALT 23 19  --   --   ALKPHOS 89 71  --   --   BILITOT 0.7 0.5  --   --   PROT 9.1* 6.9  --   --   ALBUMIN 4.6 3.5 3.3* 2.7*   Recent Labs  Lab 12/25/19 1331  LIPASE 33   No results for input(s): AMMONIA in the last 168 hours. Diabetic: No results for input(s): HGBA1C in the last 72 hours. No results for input(s): GLUCAP in the last 168 hours. Cardiac Enzymes: No results for input(s): CKTOTAL, CKMB, CKMBINDEX, TROPONINI in the last 168 hours. No results for input(s): PROBNP in the last 8760 hours. Coagulation Profile: No results for input(s): INR, PROTIME in the last 168 hours. Thyroid Function Tests: No results  for input(s): TSH, T4TOTAL, FREET4, T3FREE, THYROIDAB in the last 72 hours. Lipid Profile: No results for input(s): CHOL, HDL, LDLCALC, TRIG, CHOLHDL, LDLDIRECT in the last 72 hours. Anemia Panel: No results for input(s): VITAMINB12, FOLATE, FERRITIN, TIBC, IRON, RETICCTPCT in the last 72 hours. Urine analysis:    Component Value Date/Time   COLORURINE RED (A) 12/25/2019 1554   APPEARANCEUR TURBID (A) 12/25/2019 1554   LABSPEC  12/25/2019 1554    TEST NOT REPORTED DUE TO COLOR INTERFERENCE OF URINE PIGMENT   PHURINE  12/25/2019 1554    TEST NOT REPORTED DUE TO COLOR INTERFERENCE OF URINE PIGMENT   GLUCOSEU (A) 12/25/2019 1554    TEST NOT REPORTED DUE  TO COLOR INTERFERENCE OF URINE PIGMENT   HGBUR (A) 12/25/2019 1554    TEST NOT REPORTED DUE TO COLOR INTERFERENCE OF URINE PIGMENT   BILIRUBINUR (A) 12/25/2019 1554    TEST NOT REPORTED DUE TO COLOR INTERFERENCE OF URINE PIGMENT   KETONESUR (A) 12/25/2019 1554    TEST NOT REPORTED DUE TO COLOR INTERFERENCE OF URINE PIGMENT   PROTEINUR (A) 12/25/2019 1554    TEST NOT REPORTED DUE TO COLOR INTERFERENCE OF URINE PIGMENT   UROBILINOGEN 1.0 09/21/2014 1200   NITRITE (A) 12/25/2019 1554    TEST NOT REPORTED DUE TO COLOR INTERFERENCE OF URINE PIGMENT   LEUKOCYTESUR (A) 12/25/2019 1554    TEST NOT REPORTED DUE TO COLOR INTERFERENCE OF URINE PIGMENT   Sepsis Labs: Invalid input(s): PROCALCITONIN, [place]  Microbiology: Recent Results (from the past 240 hour(s))  SARS Coronavirus 2 by RT PCR (hospital order, performed in [facility] hospital lab) Nasopharyngeal Nasopharyngeal Swab     Status: None   Collection Time: 12/25/19  8:14 PM   Specimen: Nasopharyngeal Swab  Result Value Ref Range Status   SARS Coronavirus 2 NEGATIVE NEGATIVE Final    Comment: (NOTE) SARS-CoV-2 target nucleic acids are NOT DETECTED. The SARS-CoV-2 RNA is generally detectable in upper and lower respiratory specimens during the acute phase of infection. The  lowest concentration of SARS-CoV-2 viral copies this assay can detect is 250 copies / mL. A negative result does not preclude SARS-CoV-2 infection and should not be used as the sole basis for treatment or other patient management decisions.  A negative result may occur with improper specimen collection / handling, submission of specimen other than nasopharyngeal swab, presence of viral mutation(s) within the areas targeted by this assay, and inadequate number of viral copies (<250 copies / mL). A negative result must be combined with clinical observations, patient history, and epidemiological information. Fact Sheet for Patients:   StrictlyIdeas.no Fact Sheet for Healthcare Providers: BankingDealers.co.za This test is not yet approved or cleared  by the [place] FDA and has been authorized for detection and/or diagnosis of SARS-CoV-2 by FDA under an Emergency Use Authorization (EUA).  This EUA will remain in effect (meaning this test can be used) for the duration of the COVID-19 declaration under Section 564(b)(1) of the Act, 21 U.S.C. section 360bbb-3(b)(1), unless the authorization is terminated or revoked sooner. Performed at [facility], [place] [address], [place], [place] [address]   Gastrointestinal Panel by PCR , Stool     Status: Abnormal   Collection Time: 12/27/19  2:30 AM   Specimen: Stool  Result Value Ref Range Status   Campylobacter species NOT DETECTED NOT DETECTED Final   Plesimonas shigelloides NOT DETECTED NOT DETECTED Final   Salmonella species NOT DETECTED NOT DETECTED Final   Yersinia enterocolitica NOT DETECTED NOT DETECTED Final   Vibrio species NOT DETECTED NOT DETECTED Final   Vibrio cholerae NOT DETECTED NOT DETECTED Final   Enteroaggregative E coli (EAEC) NOT DETECTED NOT DETECTED Final   Enteropathogenic E coli (EPEC) NOT DETECTED NOT DETECTED Final   Enterotoxigenic E coli (ETEC) NOT DETECTED NOT  DETECTED Final   Shiga like toxin producing E coli (STEC) NOT DETECTED NOT DETECTED Final   Shigella/Enteroinvasive E coli (EIEC) NOT DETECTED NOT DETECTED Final   Cryptosporidium NOT DETECTED NOT DETECTED Final   Cyclospora cayetanensis NOT DETECTED NOT DETECTED Final   Entamoeba histolytica NOT DETECTED NOT DETECTED Final   Giardia lamblia NOT DETECTED NOT DETECTED Final   Adenovirus F40/41 NOT  DETECTED NOT DETECTED Final   Astrovirus NOT DETECTED NOT DETECTED Final   Norovirus GI/GII DETECTED (A) NOT DETECTED Final    Comment: RESULT CALLED TO, READ BACK BY AND VERIFIED WITH: ZENAIDA CALWITAN RN AT 0221 ON 12/27/19 SNG     Rotavirus A NOT DETECTED NOT DETECTED Final   Sapovirus (I, II, IV, and V) NOT DETECTED NOT DETECTED Final    Comment: Performed at [facility], [address] [place], [place] [address]    Radiology Studies: No results found.    Gyneth Hubka T. Lom[place]  If 7PM-7AM, please contact night-coverage [email or web address] Password [facility] 12/29/2019, 2:22 PM

## 2019-12-30 LAB — CBC
HCT: 30.5 % — ABNORMAL LOW (ref 36.0–46.0)
Hemoglobin: 10.1 g/dL — ABNORMAL LOW (ref 12.0–15.0)
MCH: 28.8 pg (ref 26.0–34.0)
MCHC: 33.1 g/dL (ref 30.0–36.0)
MCV: 86.9 fL (ref 80.0–100.0)
Platelets: 271 10*3/uL (ref 150–400)
RBC: 3.51 MIL/uL — ABNORMAL LOW (ref 3.87–5.11)
RDW: 13.8 % (ref 11.5–15.5)
WBC: 8.1 10*3/uL (ref 4.0–10.5)
nRBC: 0 % (ref 0.0–0.2)

## 2019-12-30 LAB — RENAL FUNCTION PANEL
Albumin: 2.6 g/dL — ABNORMAL LOW (ref 3.5–5.0)
Anion gap: 8 (ref 5–15)
BUN: 5 mg/dL — ABNORMAL LOW (ref 6–20)
CO2: 23 mmol/L (ref 22–32)
Calcium: 8.3 mg/dL — ABNORMAL LOW (ref 8.9–10.3)
Chloride: 108 mmol/L (ref 98–111)
Creatinine, Ser: 0.84 mg/dL (ref 0.44–1.00)
GFR calc Af Amer: >60 mL/min (ref >60)
GFR calc non Af Amer: >60 mL/min (ref >60)
Glucose, Bld: 113 mg/dL — ABNORMAL HIGH (ref 70–99)
Phosphorus: 3.6 mg/dL (ref 2.5–4.6)
Potassium: 3.5 mmol/L (ref 3.5–5.1)
Sodium: 139 mmol/L (ref 135–145)

## 2019-12-30 LAB — MAGNESIUM: Magnesium: 1.9 mg/dL (ref 1.7–2.4)

## 2019-12-30 MED ORDER — POTASSIUM CHLORIDE CRYS ER 20 MEQ PO TBCR
40.0000 meq | EXTENDED_RELEASE_TABLET | ORAL | Status: DC
  Administered 2019-12-30: 40 meq via ORAL
  Filled 2019-12-30: qty 2

## 2019-12-30 MED ORDER — ONDANSETRON HCL 4 MG PO TABS
4.0000 mg | ORAL_TABLET | Freq: Four times a day (QID) | ORAL | 0 refills | Status: DC
Start: 2019-12-30 — End: 2020-02-05

## 2019-12-30 NOTE — Discharge Summary (Signed)
Physician Discharge Summary  Amy Heath RSW:[id number] DOB: [birth year] DOA: 12/25/2019  PCP: [facility] (Inactive)  Admit date: 12/25/2019 Discharge date: 12/30/2019  Admitted From: Home Disposition: Home  Recommendations for Outpatient Follow-up:  1. Follow ups as below. 2. Please obtain CBC/BMP/Mag at follow up 3. Please follow up on the following pending results: None  Home Health: None Equipment/Devices: None  Discharge Condition: Stable CODE STATUS: Full code   [place], [facility]. Schedule an appointment as soon as possible for a visit in 1 week(s).   Contact information: [address] [place] [address] [phone]                Hospital Course: 39 year old female with no PMH admitted with intractable nausea, vomiting and diarrhea for 3 days.  Had associated abdominal pain.  CT abdomen and pelvis without acute finding.  COVID-19 PCR negative.  Has significant hypokalemia and AKI on presentation.  Admitted for intractable nausea/vomiting/diarrhea, AKI, hypokalemia and metabolic acidosis due to viral gastroenteritis.  GIP positive for norovirus.   Treated with IV fluid, antiemetics, pain medication and electrolyte replenishment.  Symptoms improved.  AKI, hypokalemia and metabolic acidosis resolved.  Felt well and ready to go home.  See individual problem list below for more hospital course. Discharge Diagnoses:  Intractable nausea/vomiting/diarrhea/abdominal pain-due to viral gastroenteritis.  GIP positive for norovirus.  CT abdomen and pelvis without significant finding.    Emesis, diarrhea and abdominal pain resolved. -Discharged on Zofran -Encourage oral hydration with Gatorade or Pedialyte.  Hypokalemia/hypomagnesemia: Resolved  AKI: Resolved.  Normocytic anemia: Hgb dropped 2 g since admission likely due to hemodilution from continuous IV fluid. -Recheck CBC at  follow-up.   Nutrition Problem: Inadequate oral intake Etiology: altered GI function  Signs/Symptoms:  (clear liquid diet)  Interventions: Boost Breeze, MVI      Discharge Exam: Vitals:   12/29/19 2059 12/30/19 0439  BP: 105/62 107/63  Pulse: 80 82  Resp: 17 18  Temp: 98.1 F (36.7 C) 98.2 F (36.8 C)  SpO2: 99% 99%    GENERAL: No apparent distress.  Nontoxic. HEENT: MMM.  Vision and hearing grossly intact.  NECK: Supple.  No apparent JVD.  RESP:  No IWOB.  Fair aeration bilaterally. CVS:  RRR. Heart sounds normal.  ABD/GI/GU: Bowel sounds present. Soft. Non tender.  MSK/EXT:  Moves extremities. No apparent deformity. No edema.  SKIN: no apparent skin lesion or wound NEURO: Awake, alert and oriented appropriately.  No apparent focal neuro deficit. PSYCH: Calm. Normal affect.  Discharge Instructions  Discharge Instructions    Call MD for:  extreme fatigue   Complete by: As directed    Call MD for:  persistant dizziness or light-headedness   Complete by: As directed    Call MD for:  persistant nausea and vomiting   Complete by: As directed    Call MD for:  temperature >100.4   Complete by: As directed    Diet general   Complete by: As directed    Discharge instructions   Complete by: As directed    It has been a pleasure taking care of you!  You were hospitalized and treated for vomiting, abdominal pain and diarrhea due to norovirus infection.  Your symptoms improved to the point we think it is safe to let you go home and follow-up with your primary care doctor.  We encourage you to keep yourself hydrated.  Suggest getting Gatorade  or Pedialyte until diarrhea resolves completely.  This infection is contagious.  It is very important that everyone in the house wash their hands with soap and water.   We may have started you on other new medications or made some changes to your home medications during this hospitalization. Please review your new medication list and  the directions carefully before you take them.    Please go to your hospital follow-up appointments or call to reschedule as recommended.   Take care,   Increase activity slowly   Complete by: As directed      Allergies as of 12/30/2019      Reactions   Tramadol Itching      Medication List    STOP taking these medications   amoxicillin 500 MG capsule Commonly known as: AMOXIL   ibuprofen 800 MG tablet Commonly known as: ADVIL   orphenadrine 100 MG tablet Commonly known as: NORFLEX   oxyCODONE-acetaminophen 5-325 MG tablet Commonly known as: PERCOCET/ROXICET   traMADol 50 MG tablet Commonly known as: ULTRAM     TAKE these medications   acetaminophen 500 MG tablet Commonly known as: TYLENOL Take 1,000 mg by mouth every 6 (six) hours as needed for mild pain. Notes to patient: Last given 6/12 at 6 am   albuterol 108 (90 Base) MCG/ACT inhaler Commonly known as: VENTOLIN HFA Inhale 2 puffs into the lungs every 4 (four) hours as needed for wheezing or shortness of breath.   amLODipine 5 MG tablet Commonly known as: NORVASC Take 5 mg by mouth daily.   cetirizine 10 MG tablet Commonly known as: ZYRTEC Take 10 mg by mouth daily.   fluticasone 50 MCG/ACT nasal spray Commonly known as: FLONASE Place 2 sprays into both nostrils daily as needed for allergies or rhinitis.   ondansetron 4 MG tablet Commonly known as: ZOFRAN Take 1 tablet (4 mg total) by mouth every 6 (six) hours. Notes to patient: Last given 6/12 at 9:16 am       Consultations:  None  Procedures/Studies:   CT Abdomen Pelvis W Contrast  Result Date: 12/25/2019 CLINICAL DATA:  Abdominal pain EXAM: CT ABDOMEN AND PELVIS WITH CONTRAST TECHNIQUE: Multidetector CT imaging of the abdomen and pelvis was performed using the standard protocol following bolus administration of intravenous contrast. CONTRAST:  146mL OMNIPAQUE IOHEXOL 300 MG/ML  SOLN COMPARISON:  March 02, 2019 FINDINGS: Lower chest: Lung  bases are clear.  There is a small hiatal hernia. Hepatobiliary: No focal liver lesions are appreciable. The gallbladder is absent. There is no appreciable biliary duct dilatation. Pancreas: There is no pancreatic mass or inflammatory focus. Spleen: No splenic lesions are evident. Adrenals/Urinary Tract: Adrenals bilaterally appear normal. Kidneys bilaterally show no evident mass or hydronephrosis on either side. There is no evident renal or ureteral calculus on either side. Urinary bladder is midline with wall thickness within normal limits. Stomach/Bowel: There is no appreciable bowel wall or mesenteric thickening. The terminal ileum appears unremarkable. There is mild lipomatous infiltration of the ileocecal valve. There is no bowel obstruction. There is no free air or portal venous air. Vascular/Lymphatic: No abdominal aortic aneurysm. No arterial vascular lesions evident. Major venous structures appear patent. There is no evident adenopathy in the abdomen or pelvis. Reproductive: Uterus anteverted.  No evident pelvic mass. Other: Appendix appears normal. No abscess or ascites in the abdomen or pelvis. Musculoskeletal: No blastic or lytic bone lesions. No intramuscular or abdominal wall lesions are evident. IMPRESSION: 1. No appreciable bowel wall or mesenteric thickening.  No evident bowel obstruction. No abscess in the abdomen or pelvis. Appendix appears normal. 2. No renal or ureteral calculus. No hydronephrosis. Urinary bladder wall thickness normal. 3.  Small hiatal hernia. 4.  Gallbladder absent. Electronically Signed   By: Lowella Grip III M.D.   On: 12/25/2019 19:08   DG CHEST PORT 1 VIEW  Result Date: 12/26/2019 CLINICAL DATA:  Shortness of breath and cough EXAM: PORTABLE CHEST 1 VIEW COMPARISON:  01/14/2017 FINDINGS: Normal heart size and mediastinal contours. No acute infiltrate or edema. No effusion or pneumothorax. No acute osseous findings. Cholecystectomy clips. IMPRESSION: Negative chest.  Electronically Signed   By: Monte Fantasia M.D.   On: 12/26/2019 07:03        The results of significant diagnostics from this hospitalization (including imaging, microbiology, ancillary and laboratory) are listed below for reference.     Microbiology: Recent Results (from the past 240 hour(s))  SARS Coronavirus 2 by RT PCR (hospital order, performed in [facility] hospital lab) Nasopharyngeal Nasopharyngeal Swab     Status: None   Collection Time: 12/25/19  8:14 PM   Specimen: Nasopharyngeal Swab  Result Value Ref Range Status   SARS Coronavirus 2 NEGATIVE NEGATIVE Final    Comment: (NOTE) SARS-CoV-2 target nucleic acids are NOT DETECTED. The SARS-CoV-2 RNA is generally detectable in upper and lower respiratory specimens during the acute phase of infection. The lowest concentration of SARS-CoV-2 viral copies this assay can detect is 250 copies / mL. A negative result does not preclude SARS-CoV-2 infection and should not be used as the sole basis for treatment or other patient management decisions.  A negative result may occur with improper specimen collection / handling, submission of specimen other than nasopharyngeal swab, presence of viral mutation(s) within the areas targeted by this assay, and inadequate number of viral copies (<250 copies / mL). A negative result must be combined with clinical observations, patient history, and epidemiological information. Fact Sheet for Patients:   StrictlyIdeas.no Fact Sheet for Healthcare Providers: BankingDealers.co.za This test is not yet approved or cleared  by the [place] FDA and has been authorized for detection and/or diagnosis of SARS-CoV-2 by FDA under an Emergency Use Authorization (EUA).  This EUA will remain in effect (meaning this test can be used) for the duration of the COVID-19 declaration under Section 564(b)(1) of the Act, 21 U.S.C. section 360bbb-3(b)(1), unless  the authorization is terminated or revoked sooner. Performed at [facility], [place] [address], [place], [place] [address]   Gastrointestinal Panel by PCR , Stool     Status: Abnormal   Collection Time: 12/27/19  2:30 AM   Specimen: Stool  Result Value Ref Range Status   Campylobacter species NOT DETECTED NOT DETECTED Final   Plesimonas shigelloides NOT DETECTED NOT DETECTED Final   Salmonella species NOT DETECTED NOT DETECTED Final   Yersinia enterocolitica NOT DETECTED NOT DETECTED Final   Vibrio species NOT DETECTED NOT DETECTED Final   Vibrio cholerae NOT DETECTED NOT DETECTED Final   Enteroaggregative E coli (EAEC) NOT DETECTED NOT DETECTED Final   Enteropathogenic E coli (EPEC) NOT DETECTED NOT DETECTED Final   Enterotoxigenic E coli (ETEC) NOT DETECTED NOT DETECTED Final   Shiga like toxin producing E coli (STEC) NOT DETECTED NOT DETECTED Final   Shigella/Enteroinvasive E coli (EIEC) NOT DETECTED NOT DETECTED Final   Cryptosporidium NOT DETECTED NOT DETECTED Final   Cyclospora cayetanensis NOT DETECTED NOT DETECTED Final   Entamoeba histolytica NOT DETECTED NOT DETECTED Final  Giardia lamblia NOT DETECTED NOT DETECTED Final   Adenovirus F40/41 NOT DETECTED NOT DETECTED Final   Astrovirus NOT DETECTED NOT DETECTED Final   Norovirus GI/GII DETECTED (A) NOT DETECTED Final    Comment: RESULT CALLED TO, READ BACK BY AND VERIFIED WITH: ZENAIDA CALWITAN RN AT 4132 ON 12/27/19 SNG     Rotavirus A NOT DETECTED NOT DETECTED Final   Sapovirus (I, II, IV, and V) NOT DETECTED NOT DETECTED Final    Comment: Performed at [facility], [address] [place], [place] [address]     Labs: BNP (last 3 results) No results for input(s): BNP in the last 8760 hours. Basic Metabolic Panel: Recent Labs  Lab 12/26/19 0934 12/27/19 0946 12/28/19 0229 12/29/19 0303 12/30/19 0341  NA 138 139 140 140 139  K 3.3* 2.8* 3.2* 3.2* 3.5  CL 107 103 104 106 108  CO2 19* 23 25 25 23     GLUCOSE 106* 103* 93 97 113*  BUN 14 8 6  5* 5*  CREATININE 0.95 0.89 0.86 0.82 0.84  CALCIUM 7.9* 8.7* 8.7* 8.5* 8.3*  MG 1.9 1.9 1.8 1.7 1.9  PHOS  --   --  3.2 3.6 3.6   Liver Function Tests: Recent Labs  Lab 12/25/19 1331 12/26/19 0143 12/28/19 0229 12/29/19 0303 12/30/19 0341  AST 28 23  --   --   --   ALT 23 19  --   --   --   ALKPHOS 89 71  --   --   --   BILITOT 0.7 0.5  --   --   --   PROT 9.1* 6.9  --   --   --   ALBUMIN 4.6 3.5 3.3* 2.7* 2.6*   Recent Labs  Lab 12/25/19 1331  LIPASE 33   No results for input(s): AMMONIA in the last 168 hours. CBC: Recent Labs  Lab 12/25/19 1331 12/26/19 0143 12/29/19 0303 12/30/19 0341  WBC 11.0* 11.1* 8.2 8.1  NEUTROABS  --  6.3  --   --   HGB 14.8 12.4 10.5* 10.1*  HCT 42.6 36.7 31.6* 30.5*  MCV 81.6 84.4 86.1 86.9  PLT 412* 332 283 271   Cardiac Enzymes: No results for input(s): CKTOTAL, CKMB, CKMBINDEX, TROPONINI in the last 168 hours. BNP: Invalid input(s): POCBNP CBG: No results for input(s): GLUCAP in the last 168 hours. D-Dimer No results for input(s): DDIMER in the last 72 hours. Hgb A1c No results for input(s): HGBA1C in the last 72 hours. Lipid Profile No results for input(s): CHOL, HDL, LDLCALC, TRIG, CHOLHDL, LDLDIRECT in the last 72 hours. Thyroid function studies No results for input(s): TSH, T4TOTAL, T3FREE, THYROIDAB in the last 72 hours.  Invalid input(s): FREET3 Anemia work up No results for input(s): VITAMINB12, FOLATE, FERRITIN, TIBC, IRON, RETICCTPCT in the last 72 hours. Urinalysis    Component Value Date/Time   COLORURINE RED (A) 12/25/2019 1554   APPEARANCEUR TURBID (A) 12/25/2019 1554   LABSPEC  12/25/2019 1554    TEST NOT REPORTED DUE TO COLOR INTERFERENCE OF URINE PIGMENT   PHURINE  12/25/2019 1554    TEST NOT REPORTED DUE TO COLOR INTERFERENCE OF URINE PIGMENT   GLUCOSEU (A) 12/25/2019 1554    TEST NOT REPORTED DUE TO COLOR INTERFERENCE OF URINE PIGMENT   HGBUR (A)  12/25/2019 1554    TEST NOT REPORTED DUE TO COLOR INTERFERENCE OF URINE PIGMENT   BILIRUBINUR (A) 12/25/2019 1554    TEST NOT REPORTED DUE TO COLOR INTERFERENCE OF  URINE PIGMENT   KETONESUR (A) 12/25/2019 1554    TEST NOT REPORTED DUE TO COLOR INTERFERENCE OF URINE PIGMENT   PROTEINUR (A) 12/25/2019 1554    TEST NOT REPORTED DUE TO COLOR INTERFERENCE OF URINE PIGMENT   UROBILINOGEN 1.0 09/21/2014 1200   NITRITE (A) 12/25/2019 1554    TEST NOT REPORTED DUE TO COLOR INTERFERENCE OF URINE PIGMENT   LEUKOCYTESUR (A) 12/25/2019 1554    TEST NOT REPORTED DUE TO COLOR INTERFERENCE OF URINE PIGMENT   Sepsis Labs Invalid input(s): PROCALCITONIN,  WBC,  LACTICIDVEN   Time coordinating discharge: 35 minutes  SIGNED:  Mercy Riding, MD  [facility] 12/30/2019, 4:35 PM  If 7PM-7AM, please contact night-coverage [email or web address] Password [email or web address]

## 2019-12-30 NOTE — Progress Notes (Signed)
Discharged patient to home, AVS given and explained. Belongings returned accordingly.

## 2019-12-30 NOTE — Discharge Instructions (Signed)
Norovirus Infection ° °Norovirus infection causes inflammation in the stomach and intestines (gastroenteritis) and food poisoning. It is caused by exposure to a virus in a group of similar viruses called noroviruses. °Norovirus spreads very easily from person to person (is very contagious). It often occurs in places where people are in close contact, such as schools, nursing homes, and cruise ships. You can get it from food, water, surfaces, or other people who have the virus (are contaminated). Norovirus is also found in the stool (feces) or vomit of infected people. You can spread the infection as soon as you feel sick, and you may continue to be contagious after you recover. °What are the causes? °This condition is caused by contact with norovirus. You can catch norovirus if you: °· Eat or drink something that is contaminated with norovirus. °· Touch surfaces or objects that are contaminated with norovirus and then put your hand in or by your mouth or nose. °· Have direct contact with an infected person who has symptoms. °· Share food, drink, or utensils with someone who is sick with norovirus. °What are the signs or symptoms? °Symptoms usually begin within 12 hours to 2 days after you become infected. Most norovirus symptoms affect the digestive system.Symptoms may include: °· Nausea. °· Vomiting. °· Diarrhea. °· Stomach cramps. °· Fever. °· Chills. °· Headache. °· Muscle aches. °· Tiredness. °How is this diagnosed? °This condition may be diagnosed based on: °· Your symptoms. °· A physical exam. °· A stool test. °How is this treated? °There is no specific treatment for norovirus. Most people get better without treatment in about 2 days. Young children, the elderly, and people who are already sick may take up to 6 days to recover. °Follow these instructions at home: °Eating and drinking °· Drink plenty of water to replace fluids that are lost through diarrhea and vomiting. This prevents dehydration. Drink enough  fluid to keep your urine clear or pale yellow. °· Drink clear fluids in small amounts as you are able. Clear fluids include water, ice chips, fruit juice with water added (diluted fruit juice), and low-calorie sports drinks. °? Avoid fluids that contain a lot of sugar or caffeine, such as energy drinks, sports drinks, and soda. °? Avoid alcohol. °· If instructed by your health care provider, drink an oral rehydration solution (ORS). This is a drink that is sold at pharmacies and retail stores. An ORS contains minerals (electrolytes) that you can lose through diarrhea and vomiting. °· Eat bland, easy-to-digest foods in small amounts as you are able. These foods include bananas, applesauce, rice, lean meats, toast, and crackers. °? Avoid spicy or fatty foods. °General instructions ° °· Rest at home while you recover. °· Do not prepare food for others while you are infected. Wait at least 3 days after you recover from the illness to do this. °· Take over-the-counter and prescription medicines only as told by your health care provider. °· Wash your hands frequently with soap and water. If soap and water are not available, use hand sanitizer. °· Make sure that all people in your household wash their hands well and often. °· Keep all follow-up visits as told by your health care provider. This is important. °How is this prevented? °To help prevent the spread of norovirus: °· Stay at home if you are feeling sick. This will reduce the risk of spreading the virus to others. °· Wash your hands often with soap and water for at least 20 seconds, especially after using the   toilet or changing a diaper. °· Wash fruits and vegetables thoroughly before peeling, preparing, or serving them. °· Throw out any food that a sick person may have touched. °· Disinfect contaminated surfaces immediately after someone in the household has been sick. Use a bleach-based household cleaner. °· Immediately remove and wash soiled clothes or  sheets. °Contact a health care provider if: °· You have vomiting, diarrhea, or stomach pain that gets worse. °· You have symptoms that do not go away after 2-6 days. °· You have a fever. °· You cannot drink without vomiting. °· You feel light-headed or dizzy. °· Your symptoms get worse. °Get help right away if: °You develop symptoms of dehydration that do not improve with fluid replacement, such as: °· Excessive sleepiness. °· Lack of tears. °· Very little urine production. °· Dry mouth. °· Muscle cramps. °· Weak pulse. °· Confusion. °Summary °· Norovirus infection is common and often occurs in places where people are in close contact, such as schools, nursing homes, and cruise ships. °· To help prevent the spread of this infection, wash hands with soap and water for at least 20 seconds before handling food or after having contact with stool or body fluids. °· There is no specific treatment for norovirus, but most people get better without treatment in about 2 days. People who are healthy when infected often recover sooner than those who are elderly, young, or already sick. °· Replace lost fluids by drinking plenty of water, or by drinking oral rehydration solution (ORS), which contains important minerals called electrolytes. This prevents dehydration. °This information is not intended to replace advice given to you by your health care provider. Make sure you discuss any questions you have with your health care provider. °Document Revised: 10/28/2018 Document Reviewed: 08/12/2016 °Elsevier Patient Education © 2020 [employer] ° °

## 2020-01-25 ENCOUNTER — Encounter: Payer: Self-pay | Admitting: Gastroenterology

## 2020-02-02 ENCOUNTER — Emergency Department (HOSPITAL_COMMUNITY): Payer: Medicaid Other

## 2020-02-02 ENCOUNTER — Inpatient Hospital Stay (HOSPITAL_COMMUNITY)
Admission: EM | Admit: 2020-02-02 | Discharge: 2020-02-11 | DRG: 446 | Disposition: A | Discharge status: Home / Self Care | Payer: Medicaid Other | Attending: Family Medicine | Admitting: Family Medicine

## 2020-02-02 ENCOUNTER — Encounter (HOSPITAL_COMMUNITY): Payer: Self-pay | Admitting: Emergency Medicine

## 2020-02-02 DIAGNOSIS — R1084 Generalized abdominal pain: Secondary | ICD-10-CM | POA: Insufficient documentation

## 2020-02-02 DIAGNOSIS — N179 Acute kidney failure, unspecified: Secondary | ICD-10-CM | POA: Diagnosis not present

## 2020-02-02 DIAGNOSIS — R112 Nausea with vomiting, unspecified: Secondary | ICD-10-CM | POA: Diagnosis present

## 2020-02-02 DIAGNOSIS — K8010 Calculus of gallbladder with chronic cholecystitis without obstruction: Principal | ICD-10-CM | POA: Diagnosis present

## 2020-02-02 DIAGNOSIS — Z20822 Contact with and (suspected) exposure to covid-19: Secondary | ICD-10-CM | POA: Diagnosis present

## 2020-02-02 DIAGNOSIS — R197 Diarrhea, unspecified: Secondary | ICD-10-CM

## 2020-02-02 HISTORY — DX: Vomiting, unspecified: R11.10

## 2020-02-02 HISTORY — DX: Nausea: R11.0

## 2020-02-02 HISTORY — DX: Depression, unspecified: F32.A

## 2020-02-02 HISTORY — DX: Other chronic pain: G89.29

## 2020-02-02 HISTORY — DX: Gastro-esophageal reflux disease with esophagitis, without bleeding: K21.00

## 2020-02-02 HISTORY — DX: Benign neoplasm of colon, unspecified: D12.6

## 2020-02-02 LAB — URINALYSIS, ROUTINE W REFLEX MICROSCOPIC
Bilirubin Urine: NEGATIVE
Glucose, UA: NEGATIVE mg/dL
Ketones, ur: 40 mg/dL — AB
Nitrite: NEGATIVE
Protein, ur: 30 mg/dL — AB
Specific Gravity, Urine: 1.025 (ref 1.005–1.030)
pH: 5.5 (ref 5.0–8.0)

## 2020-02-02 LAB — COMPREHENSIVE METABOLIC PANEL
ALT: 18 U/L (ref 0–44)
AST: 25 U/L (ref 15–41)
Albumin: 4 g/dL (ref 3.5–5.0)
Alkaline Phosphatase: 77 U/L (ref 38–126)
Anion gap: 12 (ref 5–15)
BUN: 15 mg/dL (ref 6–20)
CO2: 22 mmol/L (ref 22–32)
Calcium: 9.1 mg/dL (ref 8.9–10.3)
Chloride: 112 mmol/L — ABNORMAL HIGH (ref 98–111)
Creatinine, Ser: 1.07 mg/dL — ABNORMAL HIGH (ref 0.44–1.00)
GFR calc Af Amer: >60 mL/min (ref >60)
GFR calc non Af Amer: >60 mL/min (ref >60)
Glucose, Bld: 125 mg/dL — ABNORMAL HIGH (ref 70–99)
Potassium: 3.4 mmol/L — ABNORMAL LOW (ref 3.5–5.1)
Sodium: 146 mmol/L — ABNORMAL HIGH (ref 135–145)
Total Bilirubin: 0.7 mg/dL (ref 0.3–1.2)
Total Protein: 8 g/dL (ref 6.5–8.1)

## 2020-02-02 LAB — RAPID URINE DRUG SCREEN, HOSP PERFORMED
Amphetamines: NOT DETECTED
Barbiturates: NOT DETECTED
Benzodiazepines: NOT DETECTED
Cocaine: NOT DETECTED
Opiates: NOT DETECTED
Tetrahydrocannabinol: POSITIVE — AB

## 2020-02-02 LAB — CBC
HCT: 39.6 % (ref 36.0–46.0)
Hemoglobin: 13.3 g/dL (ref 12.0–15.0)
MCH: 28.5 pg (ref 26.0–34.0)
MCHC: 33.6 g/dL (ref 30.0–36.0)
MCV: 85 fL (ref 80.0–100.0)
Platelets: 429 10*3/uL — ABNORMAL HIGH (ref 150–400)
RBC: 4.66 MIL/uL (ref 3.87–5.11)
RDW: 13.9 % (ref 11.5–15.5)
WBC: 11.3 10*3/uL — ABNORMAL HIGH (ref 4.0–10.5)
nRBC: 0 % (ref 0.0–0.2)

## 2020-02-02 LAB — URINALYSIS, MICROSCOPIC (REFLEX): RBC / HPF: NONE SEEN RBC/hpf (ref 0–5)

## 2020-02-02 LAB — I-STAT BETA HCG BLOOD, ED (MC, WL, AP ONLY): I-stat hCG, quantitative: <5 m[IU]/mL (ref <5)

## 2020-02-02 LAB — SARS CORONAVIRUS 2 BY RT PCR (HOSPITAL ORDER, PERFORMED IN ~~LOC~~ HOSPITAL LAB): SARS Coronavirus 2: NEGATIVE

## 2020-02-02 LAB — C DIFFICILE QUICK SCREEN W PCR REFLEX
C Diff antigen: NEGATIVE
C Diff interpretation: NOT DETECTED
C Diff toxin: NEGATIVE

## 2020-02-02 LAB — MAGNESIUM: Magnesium: 1.7 mg/dL (ref 1.7–2.4)

## 2020-02-02 LAB — LIPASE, BLOOD: Lipase: 18 U/L (ref 11–51)

## 2020-02-02 MED ORDER — SODIUM CHLORIDE 0.9 % IV BOLUS
1000.0000 mL | Freq: Once | INTRAVENOUS | Status: AC
  Administered 2020-02-02: 1000 mL via INTRAVENOUS

## 2020-02-02 MED ORDER — HALOPERIDOL LACTATE 5 MG/ML IJ SOLN
5.0000 mg | Freq: Once | INTRAMUSCULAR | Status: AC
  Administered 2020-02-02: 5 mg via INTRAVENOUS
  Filled 2020-02-02: qty 1

## 2020-02-02 MED ORDER — SODIUM CHLORIDE 0.9% FLUSH: 3.0000 mL | Freq: Once | INTRAVENOUS | Status: DC

## 2020-02-02 MED ORDER — ALUM & MAG HYDROXIDE-SIMETH 200-200-20 MG/5ML PO SUSP
30.0000 mL | Freq: Once | ORAL | Status: AC
  Administered 2020-02-02: 30 mL via ORAL
  Filled 2020-02-02: qty 30

## 2020-02-02 MED ORDER — ONDANSETRON HCL 4 MG/2ML IJ SOLN
4.0000 mg | Freq: Once | INTRAMUSCULAR | Status: AC
  Administered 2020-02-02: 4 mg via INTRAVENOUS
  Filled 2020-02-02: qty 2

## 2020-02-02 MED ORDER — AMLODIPINE BESYLATE 5 MG PO TABS: 5.0000 mg | ORAL_TABLET | Freq: Every day | ORAL | Status: DC

## 2020-02-02 MED ORDER — METOCLOPRAMIDE HCL 5 MG/ML IJ SOLN
10.0000 mg | Freq: Once | INTRAMUSCULAR | Status: AC
  Administered 2020-02-02: 10 mg via INTRAVENOUS
  Filled 2020-02-02: qty 2

## 2020-02-02 MED ORDER — POTASSIUM CHLORIDE 10 MEQ/100ML IV SOLN
10.0000 meq | INTRAVENOUS | Status: AC
  Administered 2020-02-03 (×3): 10 meq via INTRAVENOUS
  Filled 2020-02-02 (×2): qty 100

## 2020-02-02 MED ORDER — HYDROMORPHONE HCL 1 MG/ML IJ SOLN
1.0000 mg | Freq: Once | INTRAMUSCULAR | Status: AC
  Administered 2020-02-02: 1 mg via INTRAVENOUS
  Filled 2020-02-02: qty 1

## 2020-02-02 MED ORDER — ACETAMINOPHEN 500 MG PO TABS
1000.0000 mg | ORAL_TABLET | Freq: Four times a day (QID) | ORAL | Status: DC | PRN
  Administered 2020-02-02 – 2020-02-08 (×7): 1000 mg via ORAL
  Filled 2020-02-02 (×8): qty 2

## 2020-02-02 MED ORDER — DICYCLOMINE HCL 10 MG PO CAPS
10.0000 mg | ORAL_CAPSULE | Freq: Three times a day (TID) | ORAL | Status: DC
  Administered 2020-02-02 – 2020-02-03 (×2): 10 mg via ORAL
  Filled 2020-02-02 (×2): qty 1

## 2020-02-02 MED ORDER — LIDOCAINE VISCOUS HCL 2 % MT SOLN
15.0000 mL | Freq: Once | OROMUCOSAL | Status: AC
  Administered 2020-02-02: 15 mL via ORAL
  Filled 2020-02-02: qty 15

## 2020-02-02 MED ORDER — PANTOPRAZOLE SODIUM 40 MG PO TBEC
40.0000 mg | DELAYED_RELEASE_TABLET | Freq: Every day | ORAL | Status: DC
  Administered 2020-02-02: 40 mg via ORAL
  Filled 2020-02-02: qty 1

## 2020-02-02 MED ORDER — IOHEXOL 300 MG/ML  SOLN
100.0000 mL | Freq: Once | INTRAMUSCULAR | Status: AC | PRN
  Administered 2020-02-02: 100 mL via INTRAVENOUS

## 2020-02-02 MED ORDER — SODIUM CHLORIDE 0.9 % IV SOLN: INTRAVENOUS | Status: AC

## 2020-02-02 MED ORDER — DICYCLOMINE HCL 20 MG PO TABS
20.0000 mg | ORAL_TABLET | Freq: Two times a day (BID) | ORAL | 0 refills | Status: AC
Start: 2020-02-02 — End: ?

## 2020-02-02 MED ORDER — ENOXAPARIN SODIUM 40 MG/0.4ML ~~LOC~~ SOLN
40.0000 mg | Freq: Every day | SUBCUTANEOUS | Status: DC
  Administered 2020-02-02 – 2020-02-10 (×9): 40 mg via SUBCUTANEOUS
  Filled 2020-02-02 (×9): qty 0.4

## 2020-02-02 MED ORDER — DICYCLOMINE HCL 10 MG PO CAPS
10.0000 mg | ORAL_CAPSULE | Freq: Once | ORAL | Status: AC
  Administered 2020-02-02: 10 mg via ORAL
  Filled 2020-02-02: qty 1

## 2020-02-02 MED ORDER — PANTOPRAZOLE SODIUM 40 MG PO TBEC: 40.0000 mg | DELAYED_RELEASE_TABLET | Freq: Every day | ORAL | Status: DC

## 2020-02-02 NOTE — ED Notes (Signed)
Pt is going to CT. 

## 2020-02-02 NOTE — H&P (Addendum)
[facility] Admission History and Physical Service Pager: [phone]  Patient name: Amy Heath Medical record number: [id number] Date of birth: [birth year] Age: 39 y.o. Gender: female  Primary Care Provider: [facility] (Inactive) Consultants:  Code Status: Full  Preferred Emergency Contact: [place], [place],  [phone]  Chief Complaint: Abdominal pain with nausea and vomiting  Assessment and Plan: Amy Heath is a 39 y.o. female presenting with R sided abdominal pain, vomiting, and diarrhea . PMH is significant for sickle cell trait and norovirus dx in 12/2019.  Acute on chronic Intractable vomiting with nausea Possible causes for intractable vomiting include infection vs GERD vs idiopathic vs cannabinoid hyperemesis syndrome. Patient reported that she has been vomiting since her discharge 12/2018. She has a minor AKI at presentation with Cr 1.07. Baseline Cr appears to be approx. .84.  She reported her vomit is normally gastric acid or contents, but she saw small amount of bright red blood and a separate incident of coffee colored emesis for the first time today. It is possible that this bright red blood is from her stomach or from a laceration in her esophagus caused by her constant vomiting, Mallory Weiss Tear. Patient WBC count is elevated at 11.3 increasing suspicion for infection, however Plt also increased and Hgb also increased from previous checks therefore could be hemoconcentration.  She is afebrile and has no known sick contacts. Patient also had a dx of Norovirus in 12/2019. Per notes the patient  was discharged on Zofran. Patient reports that she continued to have emesis after 12/2019 discharge, but presented to the ED today because she was no longer able to keep food down. She does not believe there are any foods that make her more likely to vomit, but that eating in general makes her feel worse. The patient reported  that she takes a PPI for reflux. She also reported that she does have instances where she feels that food gets "caught" in her upper esophagus. Patient described what appeared to be a possible esophageal dilation as a previous procedure she has had. She does not remember when this occurred. Platelets are also elevated at admission, 429. With 2 cell lines slightly elevated, hx of intractable vomiting the patient could be hemoconcentrated. Hemoconcentration is further supported by the slight elevation in Na (146) and slightly decreased K+ (3.4) and slight AKI. Mg is WNL at 1.7. CT abd/pelvis negative for acute cause, no signs of obstruction.  Patient UDS was positive for marijuana usage. Patient reports that she has not used MJ in at least 2 months and when she did smoke MJ it was ocassionally on the weekends. Patient has a hx of intractable vomiting and reports that her vomiting has been going on for " years." She is currently unable to maintain a job due to her intractable, intermittent vomiting. Patient has seen GI in the past. Per note from GI outpatient in 02/2019 the patient underwent cholecystectomy in 2018, but continued to have symptoms. At admission today her lipase is WNL decreasing suspicion for pancreatitis. She was being treated for GERD, but there was additional concern for possible cannabinoid hyperemesis syndrome. At her 02/2019 GI visit the patient reported that showers provided temporary relief from her vomiting; however at presentation today, 02/02/2020, she denies showers providing relief to her pain and vomiting. Patient was also followed by GYN, and it is not believed her pain and vomiting are caused by gynecological problems. The patients LMP was within the past 3  days, neg hCG.  Per [facility] note 02/2019, she has had prior endoscopy with showing gastritis, neg H. Pylori, and has also had colonoscopy unremarkable for cause, but no random biopsies.  At that time it was believed that chronic diarrhea  likely 2/2 IBS-D and plan was to bulk stools, as antidiarrheals had been unsuccessful.  Suspected GERD as caused of symptoms given documented mucosal disease with likely cannabinoid hyperemesis as well.  She was prescribed prilosec 40mg  BID, but on med list on admission, has been taking 20mg  BID.  She reports that she has a GI f/u scheduled for 9/1 with [facility]. Do not see where she has been tested for Celiac disease, and while this is less likely to be causing her symptoms, could consider adding on panel to rule out.  - Admit to observation under Dr. Ardelia Mems with FPTS - vitals per floor protocol - Clear liquid diet as tolerated - mIVFs for 12 hrs - f/u CMP in AM - f/u CBC in AM - Transition patient from home med omeprazole to pantoprazole for inpatient PPI - Start bentyl at 10mg  TID AC, HS, can consider increase to 20mg  if needed - tylenol prn pain, avoid opioids for GI pain - F/u GI panel, C diff negative - F/u EKG - Consider phenergran if patient does not prolonged QTc - Consider GI consult, has outpatient f/u scheduled - Lovenox for DVT PPx  Hypokalemia K 3.4 on admission.  Mag WNL at 1.7.  Likely 2/2 Gi losses. - f/u AM BMP - KCl 15mEq x3, as PO would likely make patient's nausea worse  Diarrhea Patient reports persistent diarrhea with her intractable vomiting. She reports that her diarrhea is "yellowish." She was dx with norovirus in 12/2019, but was treated. She was not able to f/u with PCP as her appt was the day she presented to the ED for this admission. Of note, her son had similar symptoms of norovirus in 12/2018, but is not having the same symptoms with the patient this admission. Patient was negative for C.diff.  See primary problem for information from previous GI w/u, but thought to be 2/2 IBS-D.  She has failed antidiarrheals previously and plan was to bulk stool.  Could also consider celiac w/u as do not see this in chart, although less likely cause. - Continue to  monitor - Consider loperamide if worsens - F/u GI panel - F/U GI as outpatient vs inpatient depending improvement  Abnormal UA Urine as ketones and proteinuria with bacteria present and WBC clumps. Patient reports that she has not had dysuria, but has had a fullness sensation and increased urge to urinate.  Not clear UTI, therefore will f/u urine cx and monitor for fever and symptoms prior to initiating antibiotics. - F/u UCx -mIVFs  Hypertension Diagnosed after June hospitalization.  She is currently on norvasc 5mg  QD.  - Continue home medication - Monitor BP  Headaches Patient reports that she has a hx of headaches which her PCP is treating with Tylenol. -Continue tylenol PRN for pain  Asthma Reports a history of asthma, uses albuterol prn. - Monitor O2 sat and lung sounds  Marijuana Use Patient reports no use in the last 2.5 months.  UDS + THC on admission.  Use could be contributing to principle problem. - encourage cessation    FEN/GI: Clear liquid diet Prophylaxis: Lovenox  Disposition:Observation  History of Present Illness:  Wrigley Winborne is a 39 y.o. female presenting with predominantly right sided abdominal pain, nausea and vomiting.  She states that she has continued to have abdominal pain, nausea, vomiting, diarrhea since being discharged 12/2019.  Normally not eating or drinking makes her feel better and reports that eating and drinking actually makes her feel worse. When asked if showers make her feel better she reports that they do not. She reported that "sometimes it feels like food is getting stuck" in her upper esophagus and that she will occasionally have a burning sensation in her chest after eating. Although she has a PPI, she has not seen improvement. She states that today, she felt much worse and hasn't been able to keep anything down all day and was unable to sleep last night.  She reports that the pain is often in her lower abdomen and reports that "with  throwing up, I'm getting knots in my stomach and having pain in my back." She reports generalized weakness over the past week. She will often urinate and have a BM together 4x/ day. She does not endorse dysuria, but feels that she does not completely empty her bladder. She also reports increased pain with the BM.  Patient was supposed to see PCP today for follow up regarding continued symptoms, but went to the ED instead. Her PCP had also placed her Tylenol for headaches within the past 2 months. She has not taken any of her prescribed medications today.  She reports that she has had chronic vomiting and abdominal pain off and on for many years.  She has had diarrhea only since most recent discharge in 12/2019.  She reports that her vomit is generally gastric juices and that her diarrhea is liquidy and yellow- appearing. However, today she reports a streak of bright red blood in her vomit and the first occurrence of dark coffee like emesis. During her 12/2019 admission she was diagnosed with Norovirus and her son had similar symptoms at that time, but at presentation to the ED today, 02/02/2020, the patient reports no sick contacts. She was also placed on norvasc at discharge in 12/2019.  She denies THC use, cocaine use, meth use.  She states that the last time she smoked marijuana was about 2.5 months.  Previously would use marijuana once a week.  She states that she used to drink alcohol, but hasn't had this in a long time because of feeling sick.  She denies tobacco use.  LMP 3 days ago, has had unprotected intercourse since then.  Not currently on birth control.  Review Of Systems: Per HPI with the following additions:   Review of Systems  Constitutional: Positive for appetite change and fatigue. Negative for fever.  HENT: Positive for rhinorrhea.   Eyes: Negative for visual disturbance.  Respiratory: Negative for cough and shortness of breath.   Cardiovascular: Positive for chest pain. Negative for  palpitations.  Gastrointestinal: Positive for abdominal pain, diarrhea, nausea, rectal pain and vomiting. Negative for blood in stool.  Genitourinary: Positive for decreased urine volume. Negative for dyspareunia and dysuria.  Neurological: Positive for weakness, light-headedness and headaches. Negative for syncope.     Patient Active Problem List   Diagnosis Date Noted   Nausea & vomiting 12/25/2019   ARF (acute renal failure) ([place]) 12/25/2019   Viral gastroenteritis 09/23/2013   Intractable nausea and vomiting 09/21/2013   Abdominal pain, unspecified site 09/21/2013   Hypokalemia 09/20/2013    Past Medical History: Past Medical History:  Diagnosis Date   Ovarian cyst    Pneumonia    Sickle cell trait (HCC)     Past  Surgical History: Past Surgical History:  Procedure Laterality Date   LAPAROSCOPY N/A 10/11/2012   Procedure: LAPAROSCOPY OPERATIVE;  Surgeon: Guss Bunde, MD;  Location: [facility];  Service: Gynecology;  Laterality: N/A;   LAPAROTOMY N/A 10/11/2012   Procedure: EXPLORATORY LAPAROTOMY.  Removal of  Ectopic pregnancy.  ;  Surgeon: Guss Bunde, MD;  Location: [facility];  Service: Gynecology;  Laterality: N/A;   LYSIS OF ADHESION N/A 10/11/2012   Procedure: LYSIS OF ADHESION;  Surgeon: Guss Bunde, MD;  Location: [facility];  Service: Gynecology;  Laterality: N/A;   OVARIAN CYST SURGERY     UNILATERAL SALPINGECTOMY Right 10/11/2012   Procedure: UNILATERAL SALPINGECTOMY;  Surgeon: Guss Bunde, MD;  Location: [facility];  Service: Gynecology;  Laterality: Right;    Social History: Social History   Tobacco Use   Smoking status: Never Smoker   Smokeless tobacco: Never Used  Substance Use Topics   Alcohol use: No   Drug use: No    Comment: 1 month ago   Additional social history: None  Please also refer to relevant sections of EMR.  Family History: Family History  Problem Relation Age of Onset   COPD Mother    Alcohol abuse Neg Hx      Allergies and Medications: Allergies  Allergen Reactions   Tramadol Itching   No current facility-administered medications on file prior to encounter.   Current Outpatient Medications on File Prior to Encounter  Medication Sig Dispense Refill   acetaminophen (TYLENOL) 500 MG tablet Take 1,000 mg by mouth every 6 (six) hours as needed for mild pain.     albuterol (VENTOLIN HFA) 108 (90 Base) MCG/ACT inhaler Inhale 2 puffs into the lungs every 4 (four) hours as needed for wheezing or shortness of breath.     amLODipine (NORVASC) 5 MG tablet Take 5 mg by mouth daily.     fluticasone (FLONASE) 50 MCG/ACT nasal spray Place 2 sprays into both nostrils daily as needed for allergies or rhinitis.     omeprazole (PRILOSEC) 20 MG capsule Take 20 mg by mouth 2 (two) times daily.     promethazine (PHENERGAN) 25 MG tablet Take 25 mg by mouth every 6 (six) hours as needed.     ondansetron (ZOFRAN) 4 MG tablet Take 1 tablet (4 mg total) by mouth every 6 (six) hours. (Patient not taking: Reported on 02/02/2020) 12 tablet 0    Objective: BP 125/75 (BP Location: Right Arm)    Pulse 86    Temp 98 F (36.7 C) (Oral)    Resp 18    SpO2 98%  Exam: General: patient is resting in bed. She appears tired. Eyes: Red sclera, normal EOM ENTM: Throat Clear, mucous membranes moist Neck: No lymphadenopathy noted, Thyroid of appropriate size Cardiovascular: Regular heart rate and rhythm, no murmur detected, 2+ dorsalis pedis pulses b/l Respiratory: Clear and equal to bilateral asucl. Gastrointestinal: Normoactive bowel sounds, non-distended, 1 small laparoscopic scar noted from cholecystectomy on the L side, No pain to palpation, Anus appeared normal with no laceration nor external hemorrhoids seen, Dr. Sandi Carne and Dr. Candie Chroman present for rectal exam Skin: warm and dry Psych: mood and affect appropriate for circumstance   Labs and Imaging: CBC BMET  Recent Labs  Lab 02/02/20 0945  WBC 11.3*   HGB 13.3  HCT 39.6  PLT 429*   Recent Labs  Lab 02/02/20 1134  NA 146*  K 3.4*  CL 112*  CO2 22  BUN 15  CREATININE 1.07*  GLUCOSE 125*  CALCIUM 9.1       UDS UDS + for THC.  EKG: None  CT Abdomen Pelvis W Contrast  Result Date: 02/02/2020 CLINICAL DATA:  Acute abdominal pain EXAM: CT ABDOMEN AND PELVIS WITH CONTRAST TECHNIQUE: Multidetector CT imaging of the abdomen and pelvis was performed using the standard protocol following bolus administration of intravenous contrast. CONTRAST:  188mL OMNIPAQUE IOHEXOL 300 MG/ML  SOLN COMPARISON:  December 25, 2019 FINDINGS: Lower chest: The visualized heart size within normal limits. No pericardial fluid/thickening. There is a small hiatal hernia. The visualized portions of the lungs are clear. Hepatobiliary: The liver is normal in density without focal abnormality.The main portal vein is patent. The patient is status post cholecystectomy. No biliary ductal dilation. Pancreas: Unremarkable. No pancreatic ductal dilatation or surrounding inflammatory changes. Spleen: Normal in size without focal abnormality. Adrenals/Urinary Tract: Both adrenal glands appear normal. The kidneys and collecting system appear normal without evidence of urinary tract calculus or hydronephrosis. Bladder is unremarkable. Stomach/Bowel: The stomach, small bowel, and colon are normal in appearance. No inflammatory changes, wall thickening, or obstructive findings.The appendix is normal. Vascular/Lymphatic: There are no enlarged mesenteric, retroperitoneal, or pelvic lymph nodes. No significant vascular findings are present. Reproductive: The uterus and adnexa are unremarkable. Other: No evidence of abdominal wall mass or hernia. Musculoskeletal: No acute or significant osseous findings. IMPRESSION: No acute intra-abdominal or pelvic pathology to explain the patient's symptoms. Electronically Signed   By: Prudencio Pair M.D.   On: 02/02/2020 15:17    Freida Busman,  MD 02/02/2020, 7:27 PM PGY-1, [place] Intern pager: [phone], text pages welcome  FPTS Upper-Level Resident Addendum   I have independently interviewed and examined the patient. I have discussed the above with the original author and agree with their documentation. My edits for correction/addition/clarification are in green. Please see also any attending notes.   [place] Constable, D.O. PGY-3, [facility] 02/02/2020 10:35 PM  [place] Service pager: [phone] (text pages welcome through [facility])

## 2020-02-02 NOTE — ED Notes (Signed)
Pt reports she is unable to keep down graham crackers but was able to drink gingerale

## 2020-02-02 NOTE — ED Provider Notes (Addendum)
[place] EMERGENCY DEPARTMENT Provider Note   CSN: [id number] Arrival date & time: 02/02/20  0932     History Chief Complaint  Patient presents with  . Abdominal Pain  . Emesis    Amy Heath is a 39 y.o. female.  39 year old female with recent hospitalization for norovirus from 12/25/19-12/30/19 returns with ongoing abdominal pain (more so right side), vomiting and diarrhea. Emesis described as bilious, states stools also appear loose and green, non bloody. Denies fevers, chills, sick contacts. Patient is taking Phenergan at home without relief. Patient was scheduled to see her PCP today however could not wait for the appointment due to severity of symptoms. No other complaints or concerns.         Past Medical History:  Diagnosis Date  . Ovarian cyst   . Pneumonia   . Sickle cell trait [facility])     Patient Active Problem List   Diagnosis Date Noted  . Nausea & vomiting 12/25/2019  . ARF (acute renal failure) ([place]) 12/25/2019  . Viral gastroenteritis 09/23/2013  . Intractable nausea and vomiting 09/21/2013  . Abdominal pain, unspecified site 09/21/2013  . Hypokalemia 09/20/2013    Past Surgical History:  Procedure Laterality Date  . LAPAROSCOPY N/A 10/11/2012   Procedure: LAPAROSCOPY OPERATIVE;  Surgeon: Guss Bunde, MD;  Location: [facility];  Service: Gynecology;  Laterality: N/A;  . LAPAROTOMY N/A 10/11/2012   Procedure: EXPLORATORY LAPAROTOMY.  Removal of  Ectopic pregnancy.  ;  Surgeon: Guss Bunde, MD;  Location: [facility];  Service: Gynecology;  Laterality: N/A;  . LYSIS OF ADHESION N/A 10/11/2012   Procedure: LYSIS OF ADHESION;  Surgeon: Guss Bunde, MD;  Location: [facility];  Service: Gynecology;  Laterality: N/A;  . OVARIAN CYST SURGERY    . UNILATERAL SALPINGECTOMY Right 10/11/2012   Procedure: UNILATERAL SALPINGECTOMY;  Surgeon: Guss Bunde, MD;  Location: [facility];  Service: Gynecology;  Laterality: Right;     OB History    Gravida   7   Para  6   Term  6   Preterm      AB  1   Living  6     SAB      TAB      Ectopic  1   Multiple      Live Births              Family History  Problem Relation Age of Onset  . COPD Mother   . Alcohol abuse Neg Hx     Social History   Tobacco Use  . Smoking status: Never Smoker  . Smokeless tobacco: Never Used  Substance Use Topics  . Alcohol use: No  . Drug use: No    Comment: 1 month ago    Home Medications Prior to Admission medications   Medication Sig Start Date End Date Taking? Authorizing Provider  acetaminophen (TYLENOL) 500 MG tablet Take 1,000 mg by mouth every 6 (six) hours as needed for mild pain.   Yes [provider]  albuterol (VENTOLIN HFA) 108 (90 Base) MCG/ACT inhaler Inhale 2 puffs into the lungs every 4 (four) hours as needed for wheezing or shortness of breath. 10/26/19  Yes [provider]  amLODipine (NORVASC) 5 MG tablet Take 5 mg by mouth daily. 10/17/19  Yes [provider]  fluticasone (FLONASE) 50 MCG/ACT nasal spray Place 2 sprays into both nostrils daily as needed for allergies or rhinitis.   Yes [provider]  omeprazole (PRILOSEC) 20 MG capsule Take 20 mg by mouth 2 (two) times daily. 01/04/20  Yes [provider]  promethazine (PHENERGAN) 25 MG tablet Take 25 mg by mouth every 6 (six) hours as needed. 02/01/20  Yes [provider]  dicyclomine (BENTYL) 20 MG tablet Take 1 tablet (20 mg total) by mouth 2 (two) times daily. 02/02/20   Tacy Learn, PA-C  ondansetron (ZOFRAN) 4 MG tablet Take 1 tablet (4 mg total) by mouth every 6 (six) hours. Patient not taking: Reported on 02/02/2020 12/30/19   Mercy Riding, MD    Allergies    Tramadol  Review of Systems   Review of Systems  Constitutional: Negative for chills, diaphoresis and fever.  Respiratory: Negative for shortness of breath.   Cardiovascular: Negative for chest pain.  Gastrointestinal: Positive for abdominal  pain, diarrhea, nausea and vomiting. Negative for blood in stool and constipation.  Genitourinary: Positive for decreased urine volume. Negative for dysuria.  Musculoskeletal: Negative for arthralgias and myalgias.  Skin: Negative for rash and wound.  Allergic/Immunologic: Negative for immunocompromised state.  Neurological: Negative for weakness.  Psychiatric/Behavioral: Negative for confusion.  All other systems reviewed and are negative.   Physical Exam Updated Vital Signs BP 125/75 (BP Location: Right Arm)   Pulse 86   Temp 98 F (36.7 C) (Oral)   Resp 18   SpO2 98%   Physical Exam Vitals and nursing note reviewed.  Constitutional:      General: She is not in acute distress.    Appearance: She is well-developed. She is obese. She is not diaphoretic.     Comments: Appears uncomfortable, sitting up on bed rocking  HENT:     Head: Normocephalic and atraumatic.  Cardiovascular:     Rate and Rhythm: Normal rate and regular rhythm.     Heart sounds: Normal heart sounds.  Pulmonary:     Effort: Pulmonary effort is normal.  Abdominal:     General: Bowel sounds are normal.     Palpations: Abdomen is soft.     Tenderness: There is generalized abdominal tenderness. There is right CVA tenderness and left CVA tenderness.  Skin:    General: Skin is warm and dry.     Findings: No erythema or rash.  Neurological:     Mental Status: She is alert and oriented to person, place, and time.  Psychiatric:        Behavior: Behavior normal.     ED Results / Procedures / Treatments   Labs (all labs ordered are listed, but only abnormal results are displayed) Labs Reviewed  CBC - Abnormal; Notable for the following components:      Result Value   WBC 11.3 (*)    Platelets 429 (*)    All other components within normal limits  URINALYSIS, ROUTINE W REFLEX MICROSCOPIC - Abnormal; Notable for the following components:   APPearance CLOUDY (*)    Hgb urine dipstick TRACE (*)    Ketones,  ur 40 (*)    Protein, ur 30 (*)    Leukocytes,Ua SMALL (*)    All other components within normal limits  COMPREHENSIVE METABOLIC PANEL - Abnormal; Notable for the following components:   Sodium 146 (*)    Potassium 3.4 (*)    Chloride 112 (*)    Glucose, Bld 125 (*)    Creatinine, Ser 1.07 (*)    All other components within normal limits  URINALYSIS, MICROSCOPIC (REFLEX) - Abnormal; Notable for the following components:  Bacteria, UA MANY (*)    All other components within normal limits  C DIFFICILE QUICK SCREEN W PCR REFLEX  GASTROINTESTINAL PANEL BY PCR, STOOL (REPLACES STOOL CULTURE)  SARS CORONAVIRUS 2 BY RT PCR (HOSPITAL ORDER, [facility])  LIPASE, BLOOD  MAGNESIUM  RAPID URINE DRUG SCREEN, HOSP PERFORMED  I-STAT BETA HCG BLOOD, ED ([facility], [facility], [facility] ONLY)    EKG None  Radiology CT Abdomen Pelvis W Contrast  Result Date: 02/02/2020 CLINICAL DATA:  Acute abdominal pain EXAM: CT ABDOMEN AND PELVIS WITH CONTRAST TECHNIQUE: Multidetector CT imaging of the abdomen and pelvis was performed using the standard protocol following bolus administration of intravenous contrast. CONTRAST:  162mL OMNIPAQUE IOHEXOL 300 MG/ML  SOLN COMPARISON:  December 25, 2019 FINDINGS: Lower chest: The visualized heart size within normal limits. No pericardial fluid/thickening. There is a small hiatal hernia. The visualized portions of the lungs are clear. Hepatobiliary: The liver is normal in density without focal abnormality.The main portal vein is patent. The patient is status post cholecystectomy. No biliary ductal dilation. Pancreas: Unremarkable. No pancreatic ductal dilatation or surrounding inflammatory changes. Spleen: Normal in size without focal abnormality. Adrenals/Urinary Tract: Both adrenal glands appear normal. The kidneys and collecting system appear normal without evidence of urinary tract calculus or hydronephrosis. Bladder is unremarkable. Stomach/Bowel: The stomach, small  bowel, and colon are normal in appearance. No inflammatory changes, wall thickening, or obstructive findings.The appendix is normal. Vascular/Lymphatic: There are no enlarged mesenteric, retroperitoneal, or pelvic lymph nodes. No significant vascular findings are present. Reproductive: The uterus and adnexa are unremarkable. Other: No evidence of abdominal wall mass or hernia. Musculoskeletal: No acute or significant osseous findings. IMPRESSION: No acute intra-abdominal or pelvic pathology to explain the patient's symptoms. Electronically Signed   By: Prudencio Pair M.D.   On: 02/02/2020 15:17    Procedures Procedures (including critical care time)  Medications Ordered in ED Medications  sodium chloride flush (NS) 0.9 % injection 3 mL (3 mLs Intravenous Not Given 02/02/20 0946)  sodium chloride 0.9 % bolus 1,000 mL (0 mLs Intravenous Stopped 02/02/20 1138)  HYDROmorphone (DILAUDID) injection 1 mg (1 mg Intravenous Given 02/02/20 1030)  ondansetron (ZOFRAN) injection 4 mg (4 mg Intravenous Given 02/02/20 1030)  alum & mag hydroxide-simeth (MAALOX/MYLANTA) 200-200-20 MG/5ML suspension 30 mL (30 mLs Oral Given 02/02/20 1259)    And  lidocaine (XYLOCAINE) 2 % viscous mouth solution 15 mL (15 mLs Oral Given 02/02/20 1259)  metoCLOPramide (REGLAN) injection 10 mg (10 mg Intravenous Given 02/02/20 1456)  iohexol (OMNIPAQUE) 300 MG/ML solution 100 mL (100 mLs Intravenous Contrast Given 02/02/20 1457)  dicyclomine (BENTYL) capsule 10 mg (10 mg Oral Given 02/02/20 1616)  haloperidol lactate (HALDOL) injection 5 mg (5 mg Intravenous Given 02/02/20 1809)    ED Course  I have reviewed the triage vital signs and the nursing notes.  Pertinent labs & imaging results that were available during my care of the patient were reviewed by me and considered in my medical decision making (see chart for details).  Clinical Course as of Feb 01 1900  Fri Feb 02, 2020  1747 39 yo female with abdominal pain, nausea and vomiting,  diarrhea, onset early June, admitted to this hospital and diagnosed with Norovirus. Patient returns to the ER with ongoing symptoms since discharge 1 month ago. On exam, appears uncomfortable, sitting on the edge of the bed with emesis bag containing non bilious, non blood material. Mild generalized abdominal tenderness.  Patient was given Zofran, dilaudid  and IV fluids, remained nauseous, was given Reglan and nausea resolved. Labs reviewed, no significant change from prior, c. Diff negative, panel pending/send out.  CT without acute findings. Patient is feeling better after PO Bentyl, has passed a PO challenge, plan is to dc with rx for Bentyl, recommend follow up with PCP, clear liquid diet and advance slowly as tolerated, return to ER as needed.   [LM]  1802 After discharge, patient had vomiting when given graham crackers. Plan is to give Haldol, will add UDS for possible hyperemesis. Advised clear liquid recommendation and will hold off on solid foods for now.    [LM]  6979 Discussed with Dr. Darl Householder, ER attending, plan is to consult for admission due to intractable vomiting.   [LM]  1900 Case discussed with family medicine service who will consult for admission.   [LM]    Clinical Course User Index [LM] Roque Lias   MDM Rules/Calculators/A&P                          Final Clinical Impression(s) / ED Diagnoses Final diagnoses:  Generalized abdominal pain  Nausea vomiting and diarrhea    Rx / DC Orders ED Discharge Orders         Ordered    dicyclomine (BENTYL) 20 MG tablet  2 times daily     Discontinue  Reprint     02/02/20 1537           Tacy Learn, PA-C 02/02/20 1752    Wyvonnia Dusky, MD 02/02/20 1756    Tacy Learn, PA-C 02/02/20 1901    Wyvonnia Dusky, MD 02/03/20 [id number]

## 2020-02-02 NOTE — ED Triage Notes (Signed)
Pt reports diagnosed with noro virus a few weeks ago, ongoing n/v/d and abd pain. Denies fevers.

## 2020-02-03 DIAGNOSIS — R1084 Generalized abdominal pain: Secondary | ICD-10-CM | POA: Diagnosis present

## 2020-02-03 DIAGNOSIS — K8010 Calculus of gallbladder with chronic cholecystitis without obstruction: Secondary | ICD-10-CM | POA: Diagnosis present

## 2020-02-03 DIAGNOSIS — R112 Nausea with vomiting, unspecified: Secondary | ICD-10-CM | POA: Diagnosis not present

## 2020-02-03 DIAGNOSIS — F5101 Primary insomnia: Secondary | ICD-10-CM | POA: Diagnosis not present

## 2020-02-03 DIAGNOSIS — R110 Nausea: Secondary | ICD-10-CM | POA: Diagnosis not present

## 2020-02-03 DIAGNOSIS — Z20822 Contact with and (suspected) exposure to covid-19: Secondary | ICD-10-CM | POA: Diagnosis present

## 2020-02-03 DIAGNOSIS — R197 Diarrhea, unspecified: Secondary | ICD-10-CM | POA: Diagnosis not present

## 2020-02-03 LAB — COMPREHENSIVE METABOLIC PANEL
ALT: 16 U/L (ref 0–44)
AST: 22 U/L (ref 15–41)
Albumin: 3.4 g/dL — ABNORMAL LOW (ref 3.5–5.0)
Alkaline Phosphatase: 65 U/L (ref 38–126)
Anion gap: 10 (ref 5–15)
BUN: 10 mg/dL (ref 6–20)
CO2: 24 mmol/L (ref 22–32)
Calcium: 8.4 mg/dL — ABNORMAL LOW (ref 8.9–10.3)
Chloride: 108 mmol/L (ref 98–111)
Creatinine, Ser: 0.78 mg/dL (ref 0.44–1.00)
GFR calc Af Amer: >60 mL/min (ref >60)
GFR calc non Af Amer: >60 mL/min (ref >60)
Glucose, Bld: 100 mg/dL — ABNORMAL HIGH (ref 70–99)
Potassium: 3.3 mmol/L — ABNORMAL LOW (ref 3.5–5.1)
Sodium: 142 mmol/L (ref 135–145)
Total Bilirubin: 1 mg/dL (ref 0.3–1.2)
Total Protein: 6.7 g/dL (ref 6.5–8.1)

## 2020-02-03 LAB — WET PREP, GENITAL
Clue Cells Wet Prep HPF POC: NONE SEEN
Sperm: NONE SEEN
Trich, Wet Prep: NONE SEEN
Yeast Wet Prep HPF POC: NONE SEEN

## 2020-02-03 LAB — CBC
HCT: 31.3 % — ABNORMAL LOW (ref 36.0–46.0)
Hemoglobin: 10.2 g/dL — ABNORMAL LOW (ref 12.0–15.0)
MCH: 27.9 pg (ref 26.0–34.0)
MCHC: 32.6 g/dL (ref 30.0–36.0)
MCV: 85.5 fL (ref 80.0–100.0)
Platelets: 316 10*3/uL (ref 150–400)
RBC: 3.66 MIL/uL — ABNORMAL LOW (ref 3.87–5.11)
RDW: 14.1 % (ref 11.5–15.5)
WBC: 9 10*3/uL (ref 4.0–10.5)
nRBC: 0 % (ref 0.0–0.2)

## 2020-02-03 LAB — GASTROINTESTINAL PANEL BY PCR, STOOL (REPLACES STOOL CULTURE)

## 2020-02-03 MED ORDER — PROMETHAZINE HCL 25 MG/ML IJ SOLN
12.5000 mg | Freq: Once | INTRAMUSCULAR | Status: AC
  Administered 2020-02-03: 12.5 mg via INTRAVENOUS
  Filled 2020-02-03: qty 1

## 2020-02-03 MED ORDER — ALUM & MAG HYDROXIDE-SIMETH 200-200-20 MG/5ML PO SUSP
30.0000 mL | Freq: Once | ORAL | Status: AC
  Administered 2020-02-03: 30 mL via ORAL
  Filled 2020-02-03: qty 30

## 2020-02-03 MED ORDER — LIDOCAINE VISCOUS HCL 2 % MT SOLN
15.0000 mL | Freq: Once | OROMUCOSAL | Status: AC
  Administered 2020-02-03: 15 mL via ORAL
  Filled 2020-02-03: qty 15

## 2020-02-03 MED ORDER — WHITE PETROLATUM EX OINT
TOPICAL_OINTMENT | CUTANEOUS | Status: AC
  Administered 2020-02-03: 0.2
  Filled 2020-02-03: qty 28.35

## 2020-02-03 MED ORDER — SCOPOLAMINE 1 MG/3DAYS TD PT72
1.0000 | MEDICATED_PATCH | TRANSDERMAL | Status: DC
  Administered 2020-02-03 – 2020-02-06 (×2): 1.5 mg via TRANSDERMAL
  Filled 2020-02-03 (×2): qty 1

## 2020-02-03 MED ORDER — POTASSIUM CHLORIDE 10 MEQ/100ML IV SOLN
10.0000 meq | INTRAVENOUS | Status: AC
  Administered 2020-02-03: 10 meq via INTRAVENOUS
  Filled 2020-02-03: qty 100

## 2020-02-03 MED ORDER — CAPSAICIN 0.025 % EX CREA
TOPICAL_CREAM | Freq: Two times a day (BID) | CUTANEOUS | Status: DC
  Filled 2020-02-03: qty 60

## 2020-02-03 MED ORDER — CEPHALEXIN 500 MG PO CAPS
500.0000 mg | ORAL_CAPSULE | Freq: Two times a day (BID) | ORAL | Status: DC
  Administered 2020-02-03 – 2020-02-05 (×4): 500 mg via ORAL
  Filled 2020-02-03 (×5): qty 1

## 2020-02-03 MED ORDER — POTASSIUM CHLORIDE 10 MEQ/100ML IV SOLN
10.0000 meq | INTRAVENOUS | Status: AC
  Administered 2020-02-03 (×3): 10 meq via INTRAVENOUS
  Filled 2020-02-03 (×3): qty 100

## 2020-02-03 MED ORDER — FAMOTIDINE 20 MG PO TABS
20.0000 mg | ORAL_TABLET | Freq: Every day | ORAL | Status: DC
  Administered 2020-02-05 – 2020-02-11 (×6): 20 mg via ORAL
  Filled 2020-02-03 (×7): qty 1

## 2020-02-03 MED ORDER — PANTOPRAZOLE SODIUM 40 MG PO TBEC
40.0000 mg | DELAYED_RELEASE_TABLET | Freq: Every day | ORAL | Status: DC
  Administered 2020-02-03 (×2): 40 mg via ORAL
  Filled 2020-02-03 (×2): qty 1

## 2020-02-03 MED ORDER — MELATONIN 3 MG PO TABS
3.0000 mg | ORAL_TABLET | Freq: Every day | ORAL | Status: DC
  Administered 2020-02-03 (×2): 3 mg via ORAL
  Filled 2020-02-03 (×2): qty 1

## 2020-02-03 MED ORDER — DICYCLOMINE HCL 10 MG PO CAPS
20.0000 mg | ORAL_CAPSULE | Freq: Three times a day (TID) | ORAL | Status: DC
  Administered 2020-02-03 (×3): 20 mg via ORAL
  Filled 2020-02-03 (×4): qty 2

## 2020-02-03 MED ORDER — RAMELTEON 8 MG PO TABS
8.0000 mg | ORAL_TABLET | Freq: Every evening | ORAL | Status: DC | PRN
  Administered 2020-02-04 – 2020-02-06 (×4): 8 mg via ORAL
  Filled 2020-02-03 (×8): qty 1

## 2020-02-03 NOTE — Progress Notes (Signed)
GI panel came back + for campylobacter. Will continue enteric precautions, resident on call notified.

## 2020-02-03 NOTE — Progress Notes (Signed)
Family Medicine Teaching Service Daily Progress Note Intern Pager: [phone]  Patient name: Amy Heath Medical record number: [id number] Date of birth: 11-13-1980 Age: 39 y.o. Gender: female  Primary Care Provider: [facility] (Inactive) Consultants: None  Code Status: Full   Pt Overview and Major Events to Date:  Admitted 7/16  Assessment and Plan: Amy Heath is a 39 y.o. female presenting with R sided abdominal pain, vomiting, and diarrhea. PMH is significant for recurrent episodes of nausea/vomiting, IBS-D, sickle cell trait, recent norovirus illness in 12/2019.  Acute recurrent intractable N/V: Improving. Continued yellow emesis and generalized abdominal pain.  Unclear etiology, consider viral gastroenteritis vs cyclic vomiting vs uncontrolled reflux. -Clear liquid diet, encouraged small sips only will advance as tolerated -Protonix -Bentyl QID, increase to 20 mg -Follow-up GI panel -Scopolamine patch, can add Phenergan if needed -GI cocktail as needed -Tylenol PRN abdominal pain, will avoid opioids if possible -Trial capsaicin cream to abdomen -Encourage complete avoidance of THC -Maintain GI follow-up in 03/2020  Diarrhea: Acute on chronic, unchanged. Several episodes overnight, yellowish. Possible previous diagnosis of IBS-D and previous cholecystectomy which could be contributing to current picture, however also considering gastroenteritis.   -Clear liquid diet -Follow-up GI panel, reassuringly C. difficile negative -Consider loperamide if needed -Maintain GI follow-up  Hypokalemia: Recurrent. K3.3, s/p IV replacement on 7/16.  Likely 2/2 GI losses.  -K IV 10 meq x4 -Monitor BMP  UTI: Acute. Reports increased frequency, urgency, and suprapubic abdominal pain x 2 days.  U/a with many bacteria, small leukocytes.  -Will start Keflex and follow-up urine culture  Hypertension: Chronic, stable. Normotensive. -Will hold home Norvasc  while current GI losses, add back if needed - Monitor BP  Headaches: Chronic, stable. Asymptomatic currently. -Tylenol as needed  Mild asthma: Chronic, stable. -Albuterol as needed  Marijuana Use  Endorses no use in the past 2.5 months, however UDS +THC.  -Encourage complete cessation   FEN/GI: Clear liquid diet, will advance as tolerated Prophylaxis: Lovenox  Disposition: Anticipate discharge home later tonight vs tomorrow.  Subjective:  Admitted overnight.  Reports approximately 3 episodes since admit of yellow emesis.  Additionally has had 2-3 bowel movements that are watery and yellowish.  Has been taking big gulps of fluid because she is thirsty, has not kept much down.  Still feeling nauseous.  Having a little bit of allover abdominal pain.  Reports she is struggled with intermittent episodes of nausea/vomiting for years now.  [place] she has not used any marijuana in the past 2 months.  Reports some urinary frequency and urgency for the past 2 days.  Feels similar to to previous UTIs she has had.  Objective: Temp:  [97.1 F (36.2 C)-98.8 F (37.1 C)] 97.1 F (36.2 C) (07/17 0642) Pulse Rate:  [70-99] 70 (07/17 0642) Resp:  [18-19] 18 (07/17 0642) BP: (91-141)/(53-108) 91/53 (07/17 0642) SpO2:  [98 %-100 %] 98 % (07/16 1639) Physical Exam: General: Alert, NAD HEENT: NCAT, MM slightly dry. Cardiac: RRR  Lungs: Clear bilaterally, no increased WOB  Abdomen: soft, slightly tender around suprapubic, epigastrium/upper quadrants, and periumbilic regions without any rebounding or guarding, nondistended abdomen, normoactive bowel sounds. Msk: Moves all extremities spontaneously  Ext: Warm, dry, 2+ distal pulses  Laboratory: Recent Labs  Lab 02/02/20 0945 02/03/20 0418  WBC 11.3* 9.0  HGB 13.3 10.2*  HCT 39.6 31.3*  PLT 429* 316   Recent Labs  Lab 02/02/20 1134 02/03/20 0418  NA 146* 142  K 3.4* 3.3*  CL 112* 108  CO2 22 24  BUN 15 10  CREATININE 1.07*  0.78  CALCIUM 9.1 8.4*  PROT 8.0 6.7  BILITOT 0.7 1.0  ALKPHOS 77 65  ALT 18 16  AST 25 22  GLUCOSE 125* 100*     Imaging/Diagnostic Tests: CT Abdomen Pelvis W Contrast  Result Date: 02/02/2020 CLINICAL DATA:  Acute abdominal pain EXAM: CT ABDOMEN AND PELVIS WITH CONTRAST TECHNIQUE: Multidetector CT imaging of the abdomen and pelvis was performed using the standard protocol following bolus administration of intravenous contrast. CONTRAST:  15mL OMNIPAQUE IOHEXOL 300 MG/ML  SOLN COMPARISON:  December 25, 2019 FINDINGS: Lower chest: The visualized heart size within normal limits. No pericardial fluid/thickening. There is a small hiatal hernia. The visualized portions of the lungs are clear. Hepatobiliary: The liver is normal in density without focal abnormality.The main portal vein is patent. The patient is status post cholecystectomy. No biliary ductal dilation. Pancreas: Unremarkable. No pancreatic ductal dilatation or surrounding inflammatory changes. Spleen: Normal in size without focal abnormality. Adrenals/Urinary Tract: Both adrenal glands appear normal. The kidneys and collecting system appear normal without evidence of urinary tract calculus or hydronephrosis. Bladder is unremarkable. Stomach/Bowel: The stomach, small bowel, and colon are normal in appearance. No inflammatory changes, wall thickening, or obstructive findings.The appendix is normal. Vascular/Lymphatic: There are no enlarged mesenteric, retroperitoneal, or pelvic lymph nodes. No significant vascular findings are present. Reproductive: The uterus and adnexa are unremarkable. Other: No evidence of abdominal wall mass or hernia. Musculoskeletal: No acute or significant osseous findings. IMPRESSION: No acute intra-abdominal or pelvic pathology to explain the patient's symptoms. Electronically Signed   By: Prudencio Pair M.D.   On: 02/02/2020 15:17    Patriciaann Clan, DO 02/03/2020, 7:13 AM PGY-3, [place]  Intern pager: [phone], text pages welcome

## 2020-02-03 NOTE — Progress Notes (Signed)
FPTS Interim Progress Note  Update: patient's EKG with QTc 499.  Will not be able to give antiemetics that prolong QTc.    [place], DO 02/03/2020, 12:53 AM PGY-3, [place] Medicine Service pager [phone]

## 2020-02-03 NOTE — Progress Notes (Signed)
Reassessed patient and performed pelvic exam.  She reports she is sexually active in a long-term monogamous relationship, does not use protection.  Denies any recent vaginal itching/irritation, vaginal discharge, dyspareunia.  Not currently using contraception, does have a history of a right salpingectomy s/p removal of ectopic pregnancy in 2014.  Pelvic exam: VULVA: normal appearing vulva with no masses, tenderness or lesions, VAGINA: normal appearing vagina with normal color and discharge, no lesions, CERVIX: normal appearing cervix without discharge or lesions, ADNEXA: no masses.  She is nonspecifically tender throughout entire exam, no specific cervical motion tenderness.  Collected GC/ch, wet prep.  Exam chaperoned by RN.  Additionally, GI pathogen panel returned with Campylobacter, suspect this is large contributor of clinical presentation.  Low concern for PID at this time.  Will continue to monitor and follow-up results from above.  Patriciaann Clan, DO

## 2020-02-04 ENCOUNTER — Encounter (HOSPITAL_COMMUNITY): Payer: Self-pay | Admitting: Family Medicine

## 2020-02-04 DIAGNOSIS — R197 Diarrhea, unspecified: Secondary | ICD-10-CM

## 2020-02-04 DIAGNOSIS — R110 Nausea: Secondary | ICD-10-CM

## 2020-02-04 DIAGNOSIS — F5101 Primary insomnia: Secondary | ICD-10-CM

## 2020-02-04 LAB — CBC WITH DIFFERENTIAL/PLATELET
Abs Immature Granulocytes: 0.01 10*3/uL (ref 0.00–0.07)
Basophils Absolute: 0 10*3/uL (ref 0.0–0.1)
Basophils Relative: 1 %
Eosinophils Absolute: 0.1 10*3/uL (ref 0.0–0.5)
Eosinophils Relative: 2 %
HCT: 29.2 % — ABNORMAL LOW (ref 36.0–46.0)
Hemoglobin: 9.5 g/dL — ABNORMAL LOW (ref 12.0–15.0)
Immature Granulocytes: 0 %
Lymphocytes Relative: 53 %
Lymphs Abs: 3.1 10*3/uL (ref 0.7–4.0)
MCH: 28.1 pg (ref 26.0–34.0)
MCHC: 32.5 g/dL (ref 30.0–36.0)
MCV: 86.4 fL (ref 80.0–100.0)
Monocytes Absolute: 0.7 10*3/uL (ref 0.1–1.0)
Monocytes Relative: 11 %
Neutro Abs: 1.9 10*3/uL (ref 1.7–7.7)
Neutrophils Relative %: 33 %
Platelets: 274 10*3/uL (ref 150–400)
RBC: 3.38 MIL/uL — ABNORMAL LOW (ref 3.87–5.11)
RDW: 14.2 % (ref 11.5–15.5)
WBC: 5.8 10*3/uL (ref 4.0–10.5)
nRBC: 0 % (ref 0.0–0.2)

## 2020-02-04 LAB — BASIC METABOLIC PANEL
Anion gap: 10 (ref 5–15)
Anion gap: 12 (ref 5–15)
BUN: 7 mg/dL (ref 6–20)
BUN: 6 mg/dL (ref 6–20)
CO2: 23 mmol/L (ref 22–32)
CO2: 22 mmol/L (ref 22–32)
Calcium: 8.9 mg/dL (ref 8.9–10.3)
Calcium: 9 mg/dL (ref 8.9–10.3)
Chloride: 107 mmol/L (ref 98–111)
Chloride: 106 mmol/L (ref 98–111)
Creatinine, Ser: 0.83 mg/dL (ref 0.44–1.00)
Creatinine, Ser: 0.78 mg/dL (ref 0.44–1.00)
GFR calc Af Amer: >60 mL/min (ref >60)
GFR calc Af Amer: >60 mL/min (ref >60)
GFR calc non Af Amer: >60 mL/min (ref >60)
GFR calc non Af Amer: >60 mL/min (ref >60)
Glucose, Bld: 89 mg/dL (ref 70–99)
Glucose, Bld: 109 mg/dL — ABNORMAL HIGH (ref 70–99)
Potassium: 3.2 mmol/L — ABNORMAL LOW (ref 3.5–5.1)
Potassium: 3.8 mmol/L (ref 3.5–5.1)
Sodium: 140 mmol/L (ref 135–145)
Sodium: 140 mmol/L (ref 135–145)

## 2020-02-04 LAB — TISSUE TRANSGLUTAMINASE, IGA: Tissue Transglutaminase Ab, IgA: <2 U/mL (ref 0–3)

## 2020-02-04 MED ORDER — HYDROMORPHONE HCL 1 MG/ML IJ SOLN
0.5000 mg | INTRAMUSCULAR | Status: DC | PRN
  Administered 2020-02-04 – 2020-02-07 (×13): 0.5 mg via INTRAVENOUS
  Filled 2020-02-04 (×14): qty 1

## 2020-02-04 MED ORDER — MIRTAZAPINE 15 MG PO TBDP
15.0000 mg | ORAL_TABLET | Freq: Every day | ORAL | Status: DC
  Administered 2020-02-04 – 2020-02-10 (×6): 15 mg via ORAL
  Filled 2020-02-04 (×10): qty 1

## 2020-02-04 MED ORDER — PROMETHAZINE HCL 25 MG/ML IJ SOLN
12.5000 mg | Freq: Four times a day (QID) | INTRAMUSCULAR | Status: DC | PRN
  Administered 2020-02-04 (×2): 12.5 mg via INTRAVENOUS
  Filled 2020-02-04 (×2): qty 1

## 2020-02-04 MED ORDER — SODIUM CHLORIDE 0.9 % IV SOLN: INTRAVENOUS | Status: DC

## 2020-02-04 MED ORDER — PROMETHAZINE HCL 25 MG/ML IJ SOLN
12.5000 mg | Freq: Three times a day (TID) | INTRAMUSCULAR | Status: AC
  Administered 2020-02-04 – 2020-02-07 (×12): 25 mg via INTRAVENOUS
  Filled 2020-02-04 (×13): qty 1

## 2020-02-04 MED ORDER — METOCLOPRAMIDE HCL 5 MG/ML IJ SOLN
10.0000 mg | Freq: Three times a day (TID) | INTRAMUSCULAR | Status: AC
  Administered 2020-02-04 – 2020-02-07 (×12): 10 mg via INTRAVENOUS
  Filled 2020-02-04 (×12): qty 2

## 2020-02-04 MED ORDER — PANTOPRAZOLE SODIUM 40 MG PO TBEC
40.0000 mg | DELAYED_RELEASE_TABLET | Freq: Two times a day (BID) | ORAL | Status: DC
  Administered 2020-02-04 – 2020-02-11 (×12): 40 mg via ORAL
  Filled 2020-02-04 (×13): qty 1

## 2020-02-04 MED ORDER — POTASSIUM CHLORIDE 10 MEQ/100ML IV SOLN: 10.0000 meq | INTRAVENOUS | Status: AC

## 2020-02-04 MED ORDER — SODIUM CHLORIDE 0.9 % IV SOLN
500.0000 mg | INTRAVENOUS | Status: AC
  Administered 2020-02-04 – 2020-02-06 (×3): 500 mg via INTRAVENOUS
  Filled 2020-02-04 (×3): qty 500

## 2020-02-04 MED ORDER — PROCHLORPERAZINE EDISYLATE 10 MG/2ML IJ SOLN
10.0000 mg | Freq: Once | INTRAMUSCULAR | Status: AC
  Administered 2020-02-04: 10 mg via INTRAVENOUS
  Filled 2020-02-04: qty 2

## 2020-02-04 MED ORDER — PROMETHAZINE HCL 25 MG PO TABS
12.5000 mg | ORAL_TABLET | Freq: Four times a day (QID) | ORAL | Status: DC | PRN
  Administered 2020-02-04: 12.5 mg via ORAL
  Filled 2020-02-04: qty 1

## 2020-02-04 NOTE — Progress Notes (Signed)
Resident called back with a one time order for compazine and also I asked about IVF since they have expired and no longer are running.

## 2020-02-04 NOTE — Progress Notes (Signed)
Pt states her lower transverse abdominal pain is a 10/10. DR paged with information to see if can get something for pain.

## 2020-02-04 NOTE — Progress Notes (Signed)
Family Medicine Teaching Service Daily Progress Note Intern Pager: [phone]  Patient name: Amy Heath Medical record number: [id number] Date of birth: 28-Dec-1980 Age: 39 y.o. Gender: female  Primary Care Provider: [facility] (Inactive) Consultants: None Code Status: Full  Pt Overview and Major Events to Date:  Admitted 7/16  Assessment and Plan: Amy Heath a 39 y.o.femalepresenting with R sided abdominal pain, vomiting, and diarrhea. PMH is significant for recurrent episodes of nausea/vomiting, IBS-D, sickle cell trait, recent norovirus illness in 12/2019.  Acute recurrent intractable N/V with abdominal pain. Continue to have vomiting even with Phenergan.  Emesis is yellow and patient endorsed that there were a few specks of blood in it last night. Continues to endorse 10/10 diffuse abdominal pain, but imaging and physical exam are benign.   Pain may be secondary to recurrent vomiting. Unclear etiology of vomiting, but patient was can below factor positive in stool in the skin sometimes present with vomiting in addition to diarrhea.  Gastroparesis less likely given her diarrhea. -Clear liquid diet,  will advance as tolerated -Protonix -20 mg Bentyl QID, -Start azithromycin for Campylobacter: 500mg  IV x 3d -Scopolamine patch and Phenergan as needed -GI cocktail as needed -Tylenol PRN abdominal pain, will avoid opioids if possible -Trial capsaicin cream to abdomen -Encourage complete avoidance of THC -Maintain GI follow-up in 03/2020  Diarrhea: Acute on chronic, unchanged. Continue to have yellow-colored diarrhea overnight. Possible previous diagnosis of IBS-D and previous cholecystectomy which could be contributing to current picture.  Campylobacter was found in her stool.  Will treat with azithromycin as stated above.   -Clear liquid diet -Azithromycin x3 days -Maintain GI follow-up  Hypokalemia: Recurrent. K3.2, s/p IV replacement on 7/17.   Likely 2/2 GI losses.  -K IV 10 meq x4 -Monitor BMP  UTI: Acute. Reports increased frequency, urgency, and suprapubic abdominal pain x 2 days.  U/a with many bacteria, small leukocytes.  -Keflex 500 mg twice daily x5 days -Follow-up urine culture  Hypertension: Chronic, stable. Normotensive. -Will hold home Norvasc while current GI losses, add back if needed - Monitor BP  Headaches: Chronic, stable. Asymptomatic currently. -Tylenol as needed  Mild asthma: Chronic, stable. -Albuterol as needed  Marijuana Use  Endorses no use in the past 2.5 months, however UDS +THC.  -Encourage complete cessation  Disposition: Home when medically stable  Subjective:  Patient this morning endorsed significant abdominal pain.  She states that she was nauseous all night and vomited at least twice.  Vomit was yellow with some spots of blood.  Also endorsed yellow diarrhea throughout the night.  Patient states she did not eat much yesterday, partly due to the fact that it was "just broth".  Patient asking if there is any other medication for pain that we can prescribe her.  Objective: Temp:  [98 F (36.7 C)-99.3 F (37.4 C)] 98 F (36.7 C) (07/18 0508) Pulse Rate:  [65-71] 71 (07/18 0508) Resp:  [16-18] 18 (07/18 0508) BP: (115-116)/(63-70) 115/65 (07/18 0508) SpO2:  [99 %-100 %] 100 % (07/18 0508) Weight:  [74.8 kg] 74.8 kg (07/17 1700) Physical Exam: General: Alert, oriented.  Mild distress Cardiovascular: Regular rate and rhythm, no murmurs Respiratory: Lungs clear to auscultation bilaterally, no wheezes Abdomen: Soft, mildly tender in the left upper quadrant.  Normal bowel sounds Extremities: No edema  Laboratory: Recent Labs  Lab 02/02/20 0945 02/03/20 0418 02/04/20 0200  WBC 11.3* 9.0 5.8  HGB 13.3 10.2* 9.5*  HCT 39.6 31.3* 29.2*  PLT 429* 316  274   Recent Labs  Lab 02/02/20 1134 02/03/20 0418 02/04/20 0200  NA 146* 142 140  K 3.4* 3.3* 3.2*  CL 112* 108 107   CO2 22 24 23   BUN 15 10 7   CREATININE 1.07* 0.78 0.83  CALCIUM 9.1 8.4* 8.9  PROT 8.0 6.7  --   BILITOT 0.7 1.0  --   ALKPHOS 77 65  --   ALT 18 16  --   AST 25 22  --   GLUCOSE 125* 100* 89     Imaging/Diagnostic Tests: CT Abdomen Pelvis W Contrast  Result Date: 02/02/2020 CLINICAL DATA:  Acute abdominal pain EXAM: CT ABDOMEN AND PELVIS WITH CONTRAST TECHNIQUE: Multidetector CT imaging of the abdomen and pelvis was performed using the standard protocol following bolus administration of intravenous contrast. CONTRAST:  150mL OMNIPAQUE IOHEXOL 300 MG/ML  SOLN COMPARISON:  December 25, 2019 FINDINGS: Lower chest: The visualized heart size within normal limits. No pericardial fluid/thickening. There is a small hiatal hernia. The visualized portions of the lungs are clear. Hepatobiliary: The liver is normal in density without focal abnormality.The main portal vein is patent. The patient is status post cholecystectomy. No biliary ductal dilation. Pancreas: Unremarkable. No pancreatic ductal dilatation or surrounding inflammatory changes. Spleen: Normal in size without focal abnormality. Adrenals/Urinary Tract: Both adrenal glands appear normal. The kidneys and collecting system appear normal without evidence of urinary tract calculus or hydronephrosis. Bladder is unremarkable. Stomach/Bowel: The stomach, small bowel, and colon are normal in appearance. No inflammatory changes, wall thickening, or obstructive findings.The appendix is normal. Vascular/Lymphatic: There are no enlarged mesenteric, retroperitoneal, or pelvic lymph nodes. No significant vascular findings are present. Reproductive: The uterus and adnexa are unremarkable. Other: No evidence of abdominal wall mass or hernia. Musculoskeletal: No acute or significant osseous findings. IMPRESSION: No acute intra-abdominal or pelvic pathology to explain the patient's symptoms. Electronically Signed   By: Prudencio Pair M.D.   On: 02/02/2020 15:17    Benay Pike, MD 02/04/2020, 7:57 AM PGY-3, [place] Intern pager: [phone], text pages welcome

## 2020-02-04 NOTE — Progress Notes (Signed)
Pt states no relief with the IV compazine given earlier. IV came out, IV team notified.

## 2020-02-04 NOTE — Consult Note (Addendum)
[facility] Consult: 5:05 PM 02/04/2020  LOS: 1 day    Referring Provider: Dr Larwance Rote  Primary Care Physician:  [facility] (Inactive) Primary Gastroenterologist:  [facility]: Kathreen Devoid MD (fellow), Charlyne Petrin MD/GI attending.  [facility] [employer].     Reason for Consultation:  Bilious emesis.     HPI: Amy Heath is a 39 y.o. female.  Hx SS trait.  Ovarian cyst.  Abnormal uterine bleeding.  Depression,anxiety.  Vaginal deliveries x6  Chronic recurrent N/V, abd pain.   Repeatedly THC + tox screens.  Multiple unrevealing imaging studies for n/v, abd pain.  S/p colposcopies.   03/2004 laparotomy LOA, L ovarian cystectomy/unilateral salpingectomy. (path: acute  On chronic salpingitis,oophoritis, tubo-ovarian abscess and adhesions, corpus hemorrhagicum).   08/2016 lap chole. (path: chronic cholecystitis, cholelithiasis, 1 benign reactive lymph node), at [facility].  06/2018 CTAP: hepatic steatosis.   11/2016 SBFT: negative, no explanation for NV.   11/2016 MRI brain for N/V, headaches: Normal non-con study.   08/2016 EGD,no procedure notes but path: benign gastric, esophageal tissues.   11/2017 EGD Dr Marin Comment:  LA grade B esophagitis and hiatal hernia. Biopsies of duodenum, stomach, and esophagus were all normal. A GEJ nodule was also biopsied and was normal. 11/2017 Colonoscopy Dr Marin Comment:  Grade 2 internal hemorrhoids and a single 4 mm adenomatous polyp in the descending colon, removed with cold forceps.  No random biopsies.    GI specialists suspect GERD, cannabinoid hyperemesis syndrome, musculoskeletal pain, IBS-D.   02/2019 GI visit with Kathreen Devoid MD (fellow), Charlyne Petrin MD/GI attending mentions "consider GES"; recommended Prilosec 40 in AM, no NSAIDs,  elevate HOB, avoid nocturnal eating, daily Citrucel.   Seen 12/22/19 at ED in [facility] for NV, abd pain.  Dx'd as cannabinoid hyperemesis.  RX'd Omeprazole 40 AC breakfast.   [facility] admission 6/7 - 12/30/19 with N/V, diarrhea, abd pain.  Labelled viral gastroenteritis as Norvirus detected on stool PCR.  Marland Kitchen   CTAP:  Small HH O/w unremarkable.     Hgb 14.8 >>  10.1.  Was not vomiting or passing blood.      Pt presented to [facility] 3 d ago with intractable non-bloody N/V. Stool PCR + for Campylobacter.   C diff negative.  WBCs and clue cells on Vaginal wet prep.      CTAP: unremarkable, small HH.    Hgb 11.3 >> 9.5.   LFTs and Lipase normal   Urine tox + for THC. I stat hCG negative.     Medical team now concerned by emesis evolving to bilious appearance.   meds include topical capsaicin, keflex, Protonix 40 mg at HS, Pepcid 20 po/day (started this AM), Scopalamine patch, Zithromax d 1 (for campylobacter).  prn Dilaudid, Phenergan  Stopped meds include Dicyclomine, IV Phenergan,   Pt says abd pain diffuse.  N/V is yellow which is normal for her.  Vomits 2 to 3 x day at baseline, increased to 5 to 6 x currently.  4 to 5 loose stools a  day, no blood or melena.  Describes abdominal pain as diffuse, nonfocal.  Pain worsens when nausea vomiting accelerates. Phenergan helps more than Zofran.  Does not recall treatment w Reglan.  No anti-nausea meds at home.  Compliant w AM Omeprazole.  Not using NSAIDs or any other analgesics at home, Tylenol is not effective in controlling abdominal pain. Weight of 81.1 kg 12/29/2019.  74.8 kg on 02/03/2020.  81.4 kilograms in 09/2013.  Social history Unemployed.  6 children ages 11-23.  Currently her sister is helping take care of her younger children.  Family history No colorectal cancers or disease.  No ulcer disease.    Past Medical History:  Diagnosis Date  . Adenomatous colon polyp   . Chronic headaches   . Chronic vomiting   . Depression   . Ovarian  cyst   . Pneumonia   . Reflux esophagitis   . Sickle cell trait [facility])     Past Surgical History:  Procedure Laterality Date  . COLONOSCOPY    . ESOPHAGOGASTRODUODENOSCOPY    . LAPAROSCOPIC CHOLECYSTECTOMY  2018  . LAPAROSCOPY N/A 10/11/2012   Procedure: LAPAROSCOPY OPERATIVE;  Surgeon: Guss Bunde, MD;  Location: [facility];  Service: Gynecology;  Laterality: N/A;  . LAPAROTOMY N/A 10/11/2012   Procedure: EXPLORATORY LAPAROTOMY.  Removal of  Ectopic pregnancy.  ;  Surgeon: Guss Bunde, MD;  Location: [facility];  Service: Gynecology;  Laterality: N/A;  . LYSIS OF ADHESION N/A 10/11/2012   Procedure: LYSIS OF ADHESION;  Surgeon: Guss Bunde, MD;  Location: [facility];  Service: Gynecology;  Laterality: N/A;  . OVARIAN CYST SURGERY    . UNILATERAL SALPINGECTOMY Right 10/11/2012   Procedure: UNILATERAL SALPINGECTOMY;  Surgeon: Guss Bunde, MD;  Location: [facility];  Service: Gynecology;  Laterality: Right;    Prior to Admission medications   Medication Sig Start Date End Date Taking? Authorizing Provider  acetaminophen (TYLENOL) 500 MG tablet Take 1,000 mg by mouth every 6 (six) hours as needed for mild pain.   Yes [provider]  albuterol (VENTOLIN HFA) 108 (90 Base) MCG/ACT inhaler Inhale 2 puffs into the lungs every 4 (four) hours as needed for wheezing or shortness of breath. 10/26/19  Yes [provider]  amLODipine (NORVASC) 5 MG tablet Take 5 mg by mouth daily. 10/17/19  Yes [provider]  fluticasone (FLONASE) 50 MCG/ACT nasal spray Place 2 sprays into both nostrils daily as needed for allergies or rhinitis.   Yes [provider]  omeprazole (PRILOSEC) 20 MG capsule Take 20 mg by mouth 2 (two) times daily. 01/04/20  Yes [provider]  promethazine (PHENERGAN) 25 MG tablet Take 25 mg by mouth every 6 (six) hours as needed. 02/01/20  Yes [provider]  dicyclomine (BENTYL) 20 MG tablet Take 1 tablet (20 mg total) by mouth 2 (two)  times daily. 02/02/20   Tacy Learn, PA-C  ondansetron (ZOFRAN) 4 MG tablet Take 1 tablet (4 mg total) by mouth every 6 (six) hours. Patient not taking: Reported on 02/02/2020 12/30/19   Mercy Riding, MD    Scheduled Meds: . capsaicin   Topical BID  . cephALEXin  500 mg Oral Q12H  . enoxaparin (LOVENOX) injection  40 mg Subcutaneous QHS  . famotidine  20 mg Oral Daily  . metoCLOPramide (REGLAN) injection  10 mg Intravenous TID AC & HS  . mirtazapine  15 mg Oral QHS  . pantoprazole  40 mg Oral BID  . promethazine  12.5-25 mg Intravenous TID WC & HS  . scopolamine  1 patch Transdermal Q72H  . sodium chloride flush  3 mL Intravenous Once   Infusions: . sodium chloride 125 mL/hr at 02/04/20 1242  . azithromycin 500 mg (02/04/20 1254)   PRN Meds: acetaminophen, HYDROmorphone (DILAUDID) injection, promethazine, ramelteon   Allergies as of 02/02/2020 - Review Complete 02/02/2020  Allergen Reaction Noted  . Tramadol Itching 09/21/2014    Family History  Problem Relation Age of Onset  . COPD Mother   . Alcohol abuse Neg Hx     Social History   Social History Narrative   Unemployed, single   6 children   Hx marijuana use, no tobacco, EtOH     REVIEW OF SYSTEMS: Constitutional: Feels tired.  No profound weakness. ENT:  No nose bleeds Pulm: No difficulty breathing, no cough CV:  No palpitations, no LE edema.  No chest pressure or pain GU:  No hematuria, no frequency, no dysuria. GI: See HPI. Gyn:  Menses monthly, not heavy.   Heme: Denies unusual or excessive bleeding or bruising. Transfusions: No previous transfusions with blood products. Neuro:  No headaches, no peripheral tingling or numbness.  No syncope, no seizures. Derm:  No itching, no rash or sores.  Endocrine:  No sweats or chills.  No polyuria or dysuria Immunization: Not queried.    PHYSICAL EXAM: Vital signs in last 24 hours: Vitals:   02/04/20 0508 02/04/20 1415  BP: 115/65 (!) 156/86  Pulse: 71  74  Resp: 18 18  Temp: 98 F (36.7 C)   SpO2: 100% 100%   Wt Readings from Last 3 Encounters:  02/03/20 74.8 kg  12/28/19 81.1 kg  08/13/16 73.7 kg    General: Patient is tired appearing.  Does not look acutely ill.  Not uncomfortable. Head: No facial asymmetry or swelling.  No signs of head trauma. Eyes: No scleral icterus or conjunctival pallor.  EOMI. Ears: No hearing deficit Nose: No congestion or discharge Mouth: Mucosa is moist, pink, clear.  Good dentition.  Tongue midline Neck: No JVD, no masses, no thyromegaly Lungs: Clear bilaterally.  No labored breathing or cough Heart: RRR.  No MRG.  S1, S2 present Abdomen: Soft, hypoactive bowel sounds but no tinkling or high-pitched bowel sounds.  Obese but not protuberant.  Diffusely tender to light touch but no guarding or rebound.  No HSM, masses, bruits, hernias.   Musc/Skeltl: No joint deformities or swelling Extremities: No CCE. Neurologic: Drowsy but awakens easily.  Oriented x3.  Moves all 4 limbs, strength not tested.  No tremors or involuntary movements. Skin: No rash, sores or suspicious lesions. Nodes: No cervical adenopathy Psych: Flat affect but she is drowsy.  Cooperative.  Answers questions appropriately.  Intake/Output from previous day: 07/17 0701 - 07/18 0700 In: 977 [P.O.:577; IV Piggyback:400] Out: -  Intake/Output this shift: No intake/output data recorded.  LAB RESULTS: Recent Labs    02/02/20 0945 02/03/20 0418 02/04/20 0200  WBC 11.3* 9.0 5.8  HGB 13.3 10.2* 9.5*  HCT 39.6 31.3* 29.2*  PLT 429* 316 274   BMET Lab Results  Component Value Date   NA 140 02/04/2020   NA 142 02/03/2020   NA 146 (H) 02/02/2020   K 3.2 (L) 02/04/2020   K 3.3 (L) 02/03/2020   K 3.4 (L) 02/02/2020   CL 107 02/04/2020   CL 108 02/03/2020   CL 112 (H) 02/02/2020   CO2 23 02/04/2020   CO2 24 02/03/2020   CO2 22  02/02/2020   GLUCOSE 89 02/04/2020   GLUCOSE 100 (H) 02/03/2020   GLUCOSE 125 (H) 02/02/2020    BUN 7 02/04/2020   BUN 10 02/03/2020   BUN 15 02/02/2020   CREATININE 0.83 02/04/2020   CREATININE 0.78 02/03/2020   CREATININE 1.07 (H) 02/02/2020   CALCIUM 8.9 02/04/2020   CALCIUM 8.4 (L) 02/03/2020   CALCIUM 9.1 02/02/2020   LFT Recent Labs    02/02/20 1134 02/03/20 0418  PROT 8.0 6.7  ALBUMIN 4.0 3.4*  AST 25 22  ALT 18 16  ALKPHOS 77 65  BILITOT 0.7 1.0   Lipase     Component Value Date/Time   LIPASE 18 02/02/2020 1134    Drugs of Abuse     Component Value Date/Time   LABOPIA NONE DETECTED 02/02/2020 1830   COCAINSCRNUR NONE DETECTED 02/02/2020 1830   LABBENZ NONE DETECTED 02/02/2020 1830   AMPHETMU NONE DETECTED 02/02/2020 1830   THCU POSITIVE (A) 02/02/2020 1830   LABBARB NONE DETECTED 02/02/2020 1830    CT Abdomen Pelvis W Contrast CLINICAL DATA:  Acute abdominal pain  EXAM: CT ABDOMEN AND PELVIS WITH CONTRAST  TECHNIQUE: Multidetector CT imaging of the abdomen and pelvis was performed using the standard protocol following bolus administration of intravenous contrast.  CONTRAST:  124mL OMNIPAQUE IOHEXOL 300 MG/ML  SOLN  COMPARISON:  December 25, 2019  FINDINGS: Lower chest: The visualized heart size within normal limits. No pericardial fluid/thickening.  There is a small hiatal hernia.  The visualized portions of the lungs are clear.  Hepatobiliary: The liver is normal in density without focal abnormality.The main portal vein is patent. The patient is status post cholecystectomy. No biliary ductal dilation.  Pancreas: Unremarkable. No pancreatic ductal dilatation or surrounding inflammatory changes.  Spleen: Normal in size without focal abnormality.  Adrenals/Urinary Tract: Both adrenal glands appear normal. The kidneys and collecting system appear normal without evidence of urinary tract calculus or hydronephrosis. Bladder is unremarkable.  Stomach/Bowel: The stomach, small bowel, and colon are normal in appearance. No inflammatory  changes, wall thickening, or obstructive findings.The appendix is normal.  Vascular/Lymphatic: There are no enlarged mesenteric, retroperitoneal, or pelvic lymph nodes. No significant vascular findings are present.  Reproductive: The uterus and adnexa are unremarkable.  Other: No evidence of abdominal wall mass or hernia.  Musculoskeletal: No acute or significant osseous findings.  IMPRESSION: No acute intra-abdominal or pelvic pathology to explain the patient's symptoms.  Electronically Signed   By: Prudencio Pair M.D.   On: 02/02/2020 15:17    IMPRESSION:   *   Acute on chronic N/V Pt has many years of this issue.  Hx GERD, hx cannabinoid hyperemesis.  Mild esophagitis and small HH per 11/2017 EGD  4 mm, Adenomatous colon polyp on colonoscopy 11/2017. Admission 5 weeks ago w confirmed Norovirus Now with  BV by clue cells on wet prep and campylobacter per stool PCR.   tox screen once again THC +.  Currently d 1/5 of keflex, d 1/3 of IV zithromax.     *   Hypokalemia.  Mag level ok 2 d ago.    *   Anemia.  SS trait.  Suspect the Hgb declines seen on current and June admissions reflects volume resuscitation addressing dehydrated/volume contracted states.    *   Previous surgeries including cholecystectomy, L ovarian cystectomy/salpingectomy/LOA .     PLAN:     *   Added IV Phenergan and IV Reglan AC and HS.  Up dose Protonix to 40 mg  po bid.    *   Do not see role for pursuing EGD but decision re procedures per Dr Carlean Purl.     *    quit using marijuana.      Azucena Freed PA-C  02/04/2020, 5:05 PM Phone [phone]    [facility] Attending   I have taken an interval history, reviewed the chart and examined the patient. I agree with the Advanced Practitioner's note, impression and recommendations.   Additional thoughts -   I get a hx of chronic nausea and vomiting even when not using THC - I doubt hyperemesis from [place]   She has headaches, insomnia, poor  appetite depressive sxs  She may have an uptick of sxs w/ azithromycin tx of Campylobacter which is of unclear sig to me (the Campylobacter PCR +) but appropriate to treat.  MRI brain neg in 2018  When I see these types of patients I use mirtazapine or amitriptyline or both. Since mirtazapine far more effective for chronic nausea and vomiting will start that.  Will f/u tomorrow. Cannot think of any additional tests to do.  Once improved back off promethazine and metaclopramide  Gatha Mayer, MD, [facility] 02/04/2020 5:10 PM

## 2020-02-04 NOTE — Progress Notes (Signed)
Pt is throwing up green emesis in the blue bag at bedside, states pain is at a 10/10. Resident paged with information.

## 2020-02-05 ENCOUNTER — Other Ambulatory Visit: Payer: Self-pay

## 2020-02-05 ENCOUNTER — Encounter (HOSPITAL_COMMUNITY): Payer: Self-pay | Admitting: Family Medicine

## 2020-02-05 DIAGNOSIS — R1084 Generalized abdominal pain: Secondary | ICD-10-CM | POA: Diagnosis not present

## 2020-02-05 DIAGNOSIS — R112 Nausea with vomiting, unspecified: Secondary | ICD-10-CM | POA: Diagnosis not present

## 2020-02-05 LAB — URINE CULTURE: Culture: NO GROWTH

## 2020-02-05 LAB — BASIC METABOLIC PANEL
Anion gap: 12 (ref 5–15)
BUN: 5 mg/dL — ABNORMAL LOW (ref 6–20)
CO2: 18 mmol/L — ABNORMAL LOW (ref 22–32)
Calcium: 8.6 mg/dL — ABNORMAL LOW (ref 8.9–10.3)
Chloride: 111 mmol/L (ref 98–111)
Creatinine, Ser: 0.82 mg/dL (ref 0.44–1.00)
GFR calc Af Amer: >60 mL/min (ref >60)
GFR calc non Af Amer: >60 mL/min (ref >60)
Glucose, Bld: 86 mg/dL (ref 70–99)
Potassium: 3.6 mmol/L (ref 3.5–5.1)
Sodium: 141 mmol/L (ref 135–145)

## 2020-02-05 LAB — GLIADIN ANTIBODIES, SERUM
Antigliadin Abs, IgA: 6 units (ref 0–19)
Gliadin IgG: 2 units (ref 0–19)

## 2020-02-05 LAB — GC/CHLAMYDIA PROBE AMP (~~LOC~~) NOT AT ARMC
Chlamydia: NEGATIVE
Comment: NEGATIVE
Comment: NORMAL
Neisseria Gonorrhea: NEGATIVE

## 2020-02-05 LAB — CBC
HCT: 33 % — ABNORMAL LOW (ref 36.0–46.0)
Hemoglobin: 10.8 g/dL — ABNORMAL LOW (ref 12.0–15.0)
MCH: 28.9 pg (ref 26.0–34.0)
MCHC: 32.7 g/dL (ref 30.0–36.0)
MCV: 88.2 fL (ref 80.0–100.0)
Platelets: 312 10*3/uL (ref 150–400)
RBC: 3.74 MIL/uL — ABNORMAL LOW (ref 3.87–5.11)
RDW: 14 % (ref 11.5–15.5)
WBC: 8 10*3/uL (ref 4.0–10.5)
nRBC: 0 % (ref 0.0–0.2)

## 2020-02-05 NOTE — Progress Notes (Signed)
Family Medicine Teaching Service Daily Progress Note Intern Pager: [phone]  Patient name: Amy Heath Medical record number: [id number] Date of birth: [birth year] Age: 39 y.o. Gender: female  Primary Care Provider: [facility] (Inactive) Consultants: GI Code Status: Full  Pt Overview and Major Events to Date:  Admitted 7/16  Assessment and Plan: Kerianne Riddleis a 39 y.o.femalepresenting with R sided abdominal pain, vomiting, and diarrhea.PMH is significant for recurrent episodes of nausea/vomiting, IBS-D, sickle cell trait, recent norovirus illness in 12/2019.  Acute recurrent intractable N/V with abdominal pain. Continue to have vomiting even with Phenergan.  Emesis is yellow and patient endorsed that there were a few specks of blood in it last night. Continues to endorse 10/10 diffuse abdominal pain, but imaging and physical exam are benign.  Pain may be secondary to recurrent vomiting.Unclear etiology of vomiting.  Gastroparesis less likely given her diarrhea. Patient does not appear to be having pain management with her capsaicin cream, but we will leave it as an option. -Clear liquid diet,  will advance as tolerated -Protonix increased to 40 BID -20 mg BentylQID, -Continue azithromycin for Campylobacter: 500mg  IV x 3d, Day 2/3 -Scopolamine patch  - IV Phenergan and IV Reglan AC and HS, per GI -GI cocktail as needed -TylenolPRNabdominal pain, will avoid opioids if possible -Trial capsaicin cream to abdomen -Encourage complete avoidance of THC -Maintain GI follow-up in 03/2020  Diarrhea:Acute on chronic,unchanged. Continue to have yellow-colored diarrhea overnight. Possible previous diagnosis of IBS-D and previous cholecystectomy which could be contributing to current picture.  Campylobacter was found in her stool.  Will treat with azithromycin as stated above. -Clear liquid diet -Azithromycin x3 days, Day 2/3 -Maintain GI  follow-up  Hypokalemia:Recurrent. K+ 3.6,today. Patient often is hypokalemic likely 2/2 recurrent vomiting. -Monitor BMP  JGG:EZMOQ. Reports increased frequency, urgency, and suprapubic abdominal painx 2 days.U/a with many bacteria, small leukocytes.Urine cx no growth, we will stop Keflex at this point. - Discontinue Keflex -Follow-up urine culture  Hypertension: Chronic, stable. Normotensive. -Will hold home Norvasc while current GI losses,add back if needed - Monitor BP  Headaches:Chronic, stable. Asymptomatic currently. -Tylenol as needed  Mild asthma: Chronic, stable. -Albuterol as needed  Marijuana Use Endorses no use in the past 2.5 months, however UDS+THC. -Encourage complete cessation    FEN/GI: Clear Liquid PPx: Lovenox  Disposition: Inpatient  Subjective:   Patient was having green emesis and abdominal pain 10/10 and was provided a one time dose of IV compazine. IV came out of patient, per nurse patient was not having relief from IV compazine. No other overnight events. Patient reported that she was having 9/10 abdominal pain that was diffuse across the abdomen. Patient emesis and diarrhea continue to be "yellow."  Objective: Temp:  [98.1 F (36.7 C)-98.2 F (36.8 C)] 98.2 F (36.8 C) (07/19 0432) Pulse Rate:  [74-85] 85 (07/19 0432) Resp:  [18] 18 (07/19 0432) BP: (117-156)/(65-86) 120/67 (07/19 0432) SpO2:  [100 %] 100 % (07/19 0432) Physical Exam: General: Patient in bed. Cardiovascular: Regular rate and rhythm, no murmur noted Respiratory: Clear and equal to bil. Auscl., no extra work of breathing noted Abdomen: Pain to palpation in all 4 quadrants, normoactive bowel sounds Extremities: Distal pulses intact.  Laboratory: Recent Labs  Lab 02/03/20 0418 02/04/20 0200 02/05/20 0252  WBC 9.0 5.8 8.0  HGB 10.2* 9.5* 10.8*  HCT 31.3* 29.2* 33.0*  PLT 316 274 312   Recent Labs  Lab 02/02/20 1134 02/02/20 1134 02/03/20 0418  02/03/20 0418 02/04/20  0200 02/04/20 1623 02/05/20 0252  NA 146*   < > 142   < > 140 140 141  K 3.4*   < > 3.3*   < > 3.2* 3.8 3.6  CL 112*   < > 108   < > 107 106 111  CO2 22   < > 24   < > 23 22 18*  BUN 15   < > 10   < > 7 6 5*  CREATININE 1.07*   < > 0.78   < > 0.83 0.78 0.82  CALCIUM 9.1   < > 8.4*   < > 8.9 9.0 8.6*  PROT 8.0  --  6.7  --   --   --   --   BILITOT 0.7  --  1.0  --   --   --   --   ALKPHOS 77  --  65  --   --   --   --   ALT 18  --  16  --   --   --   --   AST 25  --  22  --   --   --   --   GLUCOSE 125*   < > 100*   < > 89 109* 86   < > = values in this interval not displayed.     Imaging/Diagnostic Tests: None new  Freida Busman, MD 02/05/2020, 7:40 AM PGY-1, [place] Intern pager: [phone], text pages welcome

## 2020-02-05 NOTE — Progress Notes (Signed)
Enteric precaution discontinued per Infection prevention

## 2020-02-05 NOTE — Progress Notes (Addendum)
° ° ° °  [facility] Progress Note  CC:  N/V  Subjective:  No improvement so far with changes made yesterday.  Says that she vomited twice this morning.  Still with diarrhea and abdominal pain.  Objective:  Vital signs in last 24 hours: Temp:  [98.1 F (36.7 C)-98.2 F (36.8 C)] 98.2 F (36.8 C) (07/19 0432) Pulse Rate:  [74-85] 85 (07/19 0432) Resp:  [18] 18 (07/19 0432) BP: (117-156)/(65-86) 120/67 (07/19 0432) SpO2:  [100 %] 100 % (07/19 0432) Last BM Date: 02/05/20 General:  Alert, Well-developed, in NAD Heart:  Regular rate and rhythm; no murmurs Pulm:  CTAB.  No increased WOB. Abdomen:  Soft, non-distended.  BS present.  Mild diffuse TTP. Extremities:  Without edema. Neurologic:  Alert and oriented x 4;  grossly normal neurologically. Psych:  Alert and cooperative. Normal mood and affect.  Intake/Output from previous day: 07/18 0701 - 07/19 0700 In: 1789.9 [I.V.:1789.9] Out: -  Intake/Output this shift: Total I/O In: 240 [P.O.:240] Out: -   Lab Results: Recent Labs    02/03/20 0418 02/04/20 0200 02/05/20 0252  WBC 9.0 5.8 8.0  HGB 10.2* 9.5* 10.8*  HCT 31.3* 29.2* 33.0*  PLT 316 274 312   BMET Recent Labs    02/04/20 0200 02/04/20 1623 02/05/20 0252  NA 140 140 141  K 3.2* 3.8 3.6  CL 107 106 111  CO2 23 22 18*  GLUCOSE 89 109* 86  BUN 7 6 5*  CREATININE 0.83 0.78 0.82  CALCIUM 8.9 9.0 8.6*   LFT Recent Labs    02/03/20 0418  PROT 6.7  ALBUMIN 3.4*  AST 22  ALT 16  ALKPHOS 65  BILITOT 1.0   Assessment / Plan: *   Acute on chronic N/V Pt has many years of this issue.  Hx GERD, hx cannabinoid hyperemesis.  Mild esophagitis and small HH per 11/2017 EGD  4 mm, Adenomatous colon polyp on colonoscopy 11/2017. Admission 5 weeks ago w confirmed Norovirus Now with BV by clue cells on wet prep and campylobacter per stool PCR.   tox screen once again THC +.  Currently d 2/5 of keflex, d 2/3 of IV zithromax.    Has several  medications in place to try to help with this.    *   Hypokalemia:  Resolved.  *   Anemia.  SS trait.  Suspect the Hgb declines seen on current and June admissions reflects volume resuscitation addressing dehydrated/volume contracted states.  Hgb stable and actually improved this AM.    *   Previous surgeries including cholecystectomy, L ovarian cystectomy/salpingectomy/LOA .   -Added IV Phenergan and IV Reglan AC and HS and increased dose of Protonix to 40 mg po bid as well as addition of mirtazapine 15 mg qHS on 7/18.  No further recommendations or changes being made today.  Just give more time of current regimen. -D/C marijuana use.    -Will need follow-up with her primary GI, Dr. Mitchell Heir, in [place] upon discharge.    LOS: 2 days   Laban Emperor. Zehr  02/05/2020, 11:37 AM  GI ATTENDING  Interval history data reviewed.  Agree with interval progress note as outlined above.  Adjustments to her medical regimen made.  Hopefully this will help.  Would not expect rapid progress given the chronic nature of her problems.  Docia Chuck. Geri Seminole., M.D. [facility] Division of Gastroenterology

## 2020-02-06 DIAGNOSIS — R1084 Generalized abdominal pain: Secondary | ICD-10-CM | POA: Diagnosis not present

## 2020-02-06 DIAGNOSIS — R112 Nausea with vomiting, unspecified: Secondary | ICD-10-CM | POA: Diagnosis not present

## 2020-02-06 LAB — CBC
HCT: 29.2 % — ABNORMAL LOW (ref 36.0–46.0)
Hemoglobin: 9.7 g/dL — ABNORMAL LOW (ref 12.0–15.0)
MCH: 28.7 pg (ref 26.0–34.0)
MCHC: 33.2 g/dL (ref 30.0–36.0)
MCV: 86.4 fL (ref 80.0–100.0)
Platelets: 286 10*3/uL (ref 150–400)
RBC: 3.38 MIL/uL — ABNORMAL LOW (ref 3.87–5.11)
RDW: 13.8 % (ref 11.5–15.5)
WBC: 6.2 10*3/uL (ref 4.0–10.5)
nRBC: 0 % (ref 0.0–0.2)

## 2020-02-06 LAB — BASIC METABOLIC PANEL
Anion gap: 9 (ref 5–15)
BUN: <5 mg/dL — ABNORMAL LOW (ref 6–20)
CO2: 23 mmol/L (ref 22–32)
Calcium: 8.4 mg/dL — ABNORMAL LOW (ref 8.9–10.3)
Chloride: 109 mmol/L (ref 98–111)
Creatinine, Ser: 0.87 mg/dL (ref 0.44–1.00)
GFR calc Af Amer: >60 mL/min (ref >60)
GFR calc non Af Amer: >60 mL/min (ref >60)
Glucose, Bld: 88 mg/dL (ref 70–99)
Potassium: 3.3 mmol/L — ABNORMAL LOW (ref 3.5–5.1)
Sodium: 141 mmol/L (ref 135–145)

## 2020-02-06 MED ORDER — POTASSIUM CHLORIDE 10 MEQ/100ML IV SOLN
10.0000 meq | INTRAVENOUS | Status: AC
  Administered 2020-02-06 (×4): 10 meq via INTRAVENOUS
  Filled 2020-02-06 (×4): qty 100

## 2020-02-06 NOTE — Progress Notes (Signed)
Family Medicine Teaching Service Daily Progress Note Intern Pager: [phone]  Patient name: Amy Heath Medical record number: [id number] Date of birth: 1981-06-16 Age: 39 y.o. Gender: female  Primary Care Provider: [facility] (Inactive) Consultants: GI Code Status: FULL  Pt Overview and Major Events to Date:  Admitted 7/16  Assessment and Plan: Amy Riddleis a 39 y.o.femalepresenting with R sided abdominal pain, vomiting, and diarrhea.PMH is significant for recurrent episodes of nausea/vomiting, IBS-D, sickle cell trait, recent norovirus illness in 12/2019.  Acute recurrent intractable N/Vwith abdominal pain. Continue to have vomiting even with Phenergan. Emesis is yellow and patient endorsed that there were a few specks of blood in it last night. Continues to endorse 10/10 diffuse abdominal pain, but imaging and physical exam are benign.Pain may be secondary to recurrent vomiting.Unclear etiologyof vomiting. Gastroparesis less likely given her diarrhea. Patient does not appear to be having pain management with her capsaicin cream, but we will leave it as an option. -Clear liquid diet, will advance as tolerated -Protonix increased to 40 BID -20 mgBentylQID, -Continue azithromycin for Campylobacter: 500mg  IV x 3d, Day 3/3 -Scopolamine patch - IV Phenergan and IV Reglan AC and HS, per GI -GI cocktail as needed -TylenolPRNabdominal pain, will avoid opioids if possible -Trial capsaicin cream to abdomen -Encourage complete avoidance of THC -Maintain GI follow-up in 03/2020  Diarrhea:Acute on chronic,unchanged. Continue to have yellow-colored diarrhea overnight. Possible previous diagnosis of IBS-D and previous cholecystectomy which could be contributing to current picture. Campylobacter was found in her stool. Will treat with azithromycin as stated above. -Clear liquid diet -Azithromycin x3 days, Day 3/3 -Maintain GI  follow-up  Hypokalemia:Recurrent. K+ 3.3,today. Patient often is hypokalemic likely 2/2 recurrent vomiting. -Monitor BMP  CWU:GQBVQ. Reports increased frequency, urgency, and suprapubic abdominal painx 2 days.U/a with many bacteria, small leukocytes.Urine cx no growth, we will stop Keflex at this point. - Discontinue Keflex -Follow-up urine culture  Hypertension: Chronic, stable. Normotensive. -Will hold home Norvasc while current GI losses,add back if needed - Monitor BP  Headaches:Chronic, stable. Asymptomatic currently. -Tylenol as needed  Mild asthma: Chronic, stable. -Albuterol as needed  Marijuana Use Endorses no use in the past 2.5 months, however UDS+THC. -Encourage complete cessation   FEN/GI: Clear Liquid PPx: Lovenox  Disposition: Home  Subjective:  No overnight events. Patient reports she if doing okay. No reports of pain.  Objective: Temp:  [97.6 F (36.4 C)-98.3 F (36.8 C)] 97.6 F (36.4 C) (07/20 0409) Pulse Rate:  [69-80] 69 (07/20 0409) Resp:  [16-17] 17 (07/20 0409) BP: (106-125)/(70-80) 125/80 (07/20 0409) SpO2:  [100 %] 100 % (07/20 0409) Physical Exam: General: Pain resting in bed.  Cardiovascular: Regular rate and rythym, no murmur Respiratory: Clear and equal to auscl. Abdomen: Guarding of the left side of the abdomen. Pain to palpation in all 4 quadrants Extremities: Distal pulses intact  Laboratory: Recent Labs  Lab 02/04/20 0200 02/05/20 0252 02/06/20 0202  WBC 5.8 8.0 6.2  HGB 9.5* 10.8* 9.7*  HCT 29.2* 33.0* 29.2*  PLT 274 312 286   Recent Labs  Lab 02/02/20 1134 02/02/20 1134 02/03/20 0418 02/04/20 0200 02/04/20 1623 02/05/20 0252 02/06/20 0202  NA 146*   < > 142   < > 140 141 141  K 3.4*   < > 3.3*   < > 3.8 3.6 3.3*  CL 112*   < > 108   < > 106 111 109  CO2 22   < > 24   < > 22  18* 23  BUN 15   < > 10   < > 6 5* <5*  CREATININE 1.07*   < > 0.78   < > 0.78 0.82 0.87  CALCIUM 9.1   < > 8.4*    < > 9.0 8.6* 8.4*  PROT 8.0  --  6.7  --   --   --   --   BILITOT 0.7  --  1.0  --   --   --   --   ALKPHOS 77  --  65  --   --   --   --   ALT 18  --  16  --   --   --   --   AST 25  --  22  --   --   --   --   GLUCOSE 125*   < > 100*   < > 109* 86 88   < > = values in this interval not displayed.     Imaging/Diagnostic Tests: None new  Freida Busman, MD 02/06/2020, 8:01 AM PGY-1, [place] Intern pager: [phone], text pages welcome

## 2020-02-06 NOTE — Progress Notes (Addendum)
[facility] Progress Note  CC:  N/V  Subjective:  No better.  Still with nausea, vomiting, abdominal pain, and diarrhea.  Objective:  Vital signs in last 24 hours: Temp:  [97.6 F (36.4 C)-98.3 F (36.8 C)] 97.6 F (36.4 C) (07/20 0409) Pulse Rate:  [69-80] 69 (07/20 0409) Resp:  [16-17] 17 (07/20 0409) BP: (106-125)/(70-80) 125/80 (07/20 0409) SpO2:  [100 %] 100 % (07/20 0409) Last BM Date: 02/06/20 General:  Alert, Well-developed, in NAD Heart:  Regular rate and rhythm; no murmurs Pulm:  CTAB.  No increased WOB. Abdomen:  Soft, non-distended.  BS present.  Diffuse TTP. Extremities:  Without edema. Neurologic:  Alert and oriented x 4;  grossly normal neurologically. Psych:  Alert and cooperative. Normal mood and affect.  Intake/Output from previous day: 07/19 0701 - 07/20 0700 In: 960 [P.O.:960] Out: -  Lab Results: Recent Labs    02/04/20 0200 02/05/20 0252 02/06/20 0202  WBC 5.8 8.0 6.2  HGB 9.5* 10.8* 9.7*  HCT 29.2* 33.0* 29.2*  PLT 274 312 286   BMET Recent Labs    02/04/20 1623 02/05/20 0252 02/06/20 0202  NA 140 141 141  K 3.8 3.6 3.3*  CL 106 111 109  CO2 22 18* 23  GLUCOSE 109* 86 88  BUN 6 5* <5*  CREATININE 0.78 0.82 0.87  CALCIUM 9.0 8.6* 8.4*   Assessment / Plan: * Acute on chronic N/V Pt has many years of this issue. Hx GERD, hx cannabinoid hyperemesis.  Mild esophagitis and small HH per5/2019 EGD 4 mm, Adenomatous colon polyp on colonoscopy 11/2017. Admission 5 weeks ago w confirmed Norovirus Now with BV by clue cells on wet prep and campylobacter per stool PCR.  tox screen once again THC +. Currently d 2/5 of keflex, d 2/3 of IV zithromax. Has several medications in place to try to help with this.    * Hypokalemia:  K+ back down to 3.3 today so being replaced.  * Anemia. SS trait. Suspect the Hgb declines seen on current and June admissions reflects volume resuscitation addressing  dehydrated/volume contracted states.  Hgb stable overall.  * Previous surgeries including cholecystectomy, L ovarian cystectomy/salpingectomy/LOA .   -Added IV Phenergan and IV Reglan AC and HS and increased dose of Protonix to 40 mg po bid as well as addition of mirtazapine 15 mg qHS on 7/18.  Is also on pepcid and scopolamine patch.  No further recommendations or changes being made today.  Just give more time of current regimen.  Not sure what else to add as she is on an extensive regimen.  No utility in repeating EGD. -D/C marijuana use.  -Will need follow-up with her primary GI, Dr. Mitchell Heir, in [place] upon discharge.    LOS: 3 days   Laban Emperor. Zehr  02/06/2020, 12:41 PM  GI ATTENDING  Interval history data reviewed.  Agree with interval progress note as outlined above.  No significant change in symptom complex.  On high-dose PPI, promotility agent, antiemetics, and recently started antidepressant.  I would recommend STOPPING NARCOTICS.  Currently getting Dilaudid on a regular basis.  I see no objective reason for narcotic use.  There may be secondary gain issues in continuing to provide such.  As well, this can significantly impair her GI motility and promote issues with nausea and vomiting.  Next, she needs to discontinue chronic cannabis use as this may also cause chronic nausea with vomiting.  Finally, previous upper endoscopy with esophagitis, as expected,  noted.  No value in repeating upper endoscopy in this patient, at this time.  At this time, recommendations maximized and nothing further to add from GI standpoint.  Obviously, advance her diet as tolerated.  After she is discharged, she should follow-up with her primary GI doctor in [facility].  We will sign off.  Docia Chuck. Geri Seminole., M.D. [facility] Division of Gastroenterology

## 2020-02-07 LAB — RENAL FUNCTION PANEL
Albumin: 3.2 g/dL — ABNORMAL LOW (ref 3.5–5.0)
Anion gap: 7 (ref 5–15)
BUN: <5 mg/dL — ABNORMAL LOW (ref 6–20)
CO2: 24 mmol/L (ref 22–32)
Calcium: 8.7 mg/dL — ABNORMAL LOW (ref 8.9–10.3)
Chloride: 108 mmol/L (ref 98–111)
Creatinine, Ser: 0.85 mg/dL (ref 0.44–1.00)
GFR calc Af Amer: >60 mL/min (ref >60)
GFR calc non Af Amer: >60 mL/min (ref >60)
Glucose, Bld: 87 mg/dL (ref 70–99)
Phosphorus: 4.1 mg/dL (ref 2.5–4.6)
Potassium: 3.3 mmol/L — ABNORMAL LOW (ref 3.5–5.1)
Sodium: 139 mmol/L (ref 135–145)

## 2020-02-07 LAB — RETICULIN ANTIBODIES, IGA W TITER: Reticulin Ab, IgA: NEGATIVE titer (ref <2.5)

## 2020-02-07 MED ORDER — METOCLOPRAMIDE HCL 5 MG/ML IJ SOLN
10.0000 mg | Freq: Once | INTRAMUSCULAR | Status: AC
  Administered 2020-02-07: 10 mg via INTRAVENOUS
  Filled 2020-02-07: qty 2

## 2020-02-07 MED ORDER — POTASSIUM CHLORIDE CRYS ER 20 MEQ PO TBCR
40.0000 meq | EXTENDED_RELEASE_TABLET | Freq: Once | ORAL | Status: AC
  Administered 2020-02-07: 40 meq via ORAL
  Filled 2020-02-07: qty 2

## 2020-02-07 MED ORDER — PROMETHAZINE HCL 25 MG/ML IJ SOLN
12.5000 mg | Freq: Three times a day (TID) | INTRAMUSCULAR | Status: DC | PRN
  Administered 2020-02-07 – 2020-02-08 (×3): 25 mg via INTRAVENOUS
  Filled 2020-02-07 (×3): qty 1

## 2020-02-07 MED ORDER — OXYCODONE HCL 5 MG PO TABS
2.5000 mg | ORAL_TABLET | Freq: Once | ORAL | Status: DC
  Filled 2020-02-07: qty 1

## 2020-02-07 NOTE — Progress Notes (Signed)
Family Medicine Teaching Service Daily Progress Note Intern Pager: [phone]  Patient name: Amy Heath Medical record number: [id number] Date of birth: 1981/06/03 Age: 39 y.o. Gender: female  Primary Care Provider: [facility] (Inactive) Consultants: GI Code Status: Full  Pt Overview and Major Events to Date:  Admitted 7/16  Assessment and Plan: Amy Riddleis a 39 y.o.femalepresenting with R sided abdominal pain, vomiting, and diarrhea.PMH is significant for recurrent episodes of nausea/vomiting, IBS-D, sickle cell trait, recent norovirus illness in 12/2019.  Acute recurrent intractable N/Vwith abdominal pain. Emesis remains yellow. Endorses 7/10 belly pain. Patient reports that her nausea is only around the time she has a BM. She is also reported decreased emesis, but did have trouble keeping grits down yesterday. She is actively trying to advance her diet and is doing a repeat grits trial today. diffuse abdominal pain, but imaging and physical exam are benign. Patient endorses increased urination, we will stop he NS mIVF. Patient does not have AKI, Cr 0.85 and Na is 139.  Patient continues to have yellow stool and some abdominal pain.Patient is ambulating in room. -Clear liquid diet, will advance as tolerated -Protonix 40 BID -Completed azithromycin for Campylobacter: 500mg  IV x 3d, completed 7/20 - IV Phenergan and IV Reglan AC and HS, per GI -TylenolPRNabdominal pain, will avoid opioids if possible -Encourage complete avoidance of THC -Maintain GI follow-up in 03/2020 - Dilaudid has been Discontinued  Diarrhea:Acute on chronic,unchanged. Continue to have yellow-colored diarrhea overnight. Possible previous diagnosis of IBS-D and previous cholecystectomy which could be contributing to current picture.  -Clear liquid diet, advance as tolerated -Azithromycin x3 days, completed 7/20 -Maintain GI follow-up  Hypokalemia:Recurrent. K+  3.3,today. Patient often is hypokalemic likely 2/2 recurrent vomiting. K replenished by GI, today. -Monitor BMP - Replenish as necessary  CXK:GYJEH. Urine cx no growth to date -Follow-up urine culture  Hypertension: Chronic, stable. Normotensive. -Will hold home Norvasc while current GI losses,add back if needed - Monitor BP  Headaches:Chronic, stable. Asymptomatic currently. -Tylenol as needed  Mild asthma: Chronic, stable. -Albuterol as needed  Marijuana Use Endorses no use in the past 2.5 months, however UDS+THC. -Encourage complete cessation   FEN/GI: Liquids, advance as tolerated PPx: Lovenox  Disposition: Home  Subjective:  patient had no overnight events. She attempted to advance her diet yesterday with grits; however she was unable to keep it down. She is willing to try again today. She reports that her nausea is no only associated with BM. She reports that she is feeling slightly better and that she is having less episodes of emesis, but continues to have diarrhea. Both emesis and diarrhea remain yellow, per her description. Patient reports she is ambulating around her room more.  Objective: Temp:  [98.2 F (36.8 C)-99.6 F (37.6 C)] 99 F (37.2 C) (07/21 0656) Pulse Rate:  [70-73] 70 (07/21 0656) Resp:  [14-18] 18 (07/21 0656) BP: (115-119)/(71-76) 119/76 (07/21 0656) SpO2:  [100 %] 100 % (07/21 0656) Physical Exam: General: Sitting upright in normal pleasant mood, appears improved from yesterday Cardiovascular: Regular rate and rhythm, no murmur detected Respiratory: Clear and equal to bil. Asucl., no extra work of breathing noted Abdomen: Normoactive bowel sounds, pain to palpation in all 4 quadrants Extremities: Distal pulses are intact, no edema noted  Laboratory: Recent Labs  Lab 02/04/20 0200 02/05/20 0252 02/06/20 0202  WBC 5.8 8.0 6.2  HGB 9.5* 10.8* 9.7*  HCT 29.2* 33.0* 29.2*  PLT 274 312 286   Recent Labs  Lab  02/02/20 1134 02/02/20 1134 02/03/20 0418 02/04/20 0200 02/05/20 0252 02/06/20 0202 02/07/20 0145  NA 146*   < > 142   < > 141 141 139  K 3.4*   < > 3.3*   < > 3.6 3.3* 3.3*  CL 112*   < > 108   < > 111 109 108  CO2 22   < > 24   < > 18* 23 24  BUN 15   < > 10   < > 5* <5* <5*  CREATININE 1.07*   < > 0.78   < > 0.82 0.87 0.85  CALCIUM 9.1   < > 8.4*   < > 8.6* 8.4* 8.7*  PROT 8.0  --  6.7  --   --   --   --   BILITOT 0.7  --  1.0  --   --   --   --   ALKPHOS 77  --  65  --   --   --   --   ALT 18  --  16  --   --   --   --   AST 25  --  22  --   --   --   --   GLUCOSE 125*   < > 100*   < > 86 88 87   < > = values in this interval not displayed.      Imaging/Diagnostic Tests: None new  Freida Busman, MD 02/07/2020, 8:00 AM PGY-1, [place] Intern pager: [phone], text pages welcome

## 2020-02-08 LAB — CBC
HCT: 34.7 % — ABNORMAL LOW (ref 36.0–46.0)
Hemoglobin: 11.6 g/dL — ABNORMAL LOW (ref 12.0–15.0)
MCH: 28 pg (ref 26.0–34.0)
MCHC: 33.4 g/dL (ref 30.0–36.0)
MCV: 83.8 fL (ref 80.0–100.0)
Platelets: 327 10*3/uL (ref 150–400)
RBC: 4.14 MIL/uL (ref 3.87–5.11)
RDW: 13.7 % (ref 11.5–15.5)
WBC: 8.8 10*3/uL (ref 4.0–10.5)
nRBC: 0 % (ref 0.0–0.2)

## 2020-02-08 LAB — HEPATIC FUNCTION PANEL
ALT: 16 U/L (ref 0–44)
AST: 14 U/L — ABNORMAL LOW (ref 15–41)
Albumin: 3.8 g/dL (ref 3.5–5.0)
Alkaline Phosphatase: 68 U/L (ref 38–126)
Bilirubin, Direct: <0.1 mg/dL (ref 0.0–0.2)
Total Bilirubin: 1.1 mg/dL (ref 0.3–1.2)
Total Protein: 7.2 g/dL (ref 6.5–8.1)

## 2020-02-08 LAB — BASIC METABOLIC PANEL
Anion gap: 15 (ref 5–15)
BUN: <5 mg/dL — ABNORMAL LOW (ref 6–20)
CO2: 20 mmol/L — ABNORMAL LOW (ref 22–32)
Calcium: 9.8 mg/dL (ref 8.9–10.3)
Chloride: 103 mmol/L (ref 98–111)
Creatinine, Ser: 0.86 mg/dL (ref 0.44–1.00)
GFR calc Af Amer: >60 mL/min (ref >60)
GFR calc non Af Amer: >60 mL/min (ref >60)
Glucose, Bld: 127 mg/dL — ABNORMAL HIGH (ref 70–99)
Potassium: 4.4 mmol/L (ref 3.5–5.1)
Sodium: 138 mmol/L (ref 135–145)

## 2020-02-08 MED ORDER — METOCLOPRAMIDE HCL 5 MG/ML IJ SOLN
10.0000 mg | Freq: Once | INTRAMUSCULAR | Status: AC
  Administered 2020-02-08: 10 mg via INTRAVENOUS
  Filled 2020-02-08: qty 2

## 2020-02-08 MED ORDER — SCOPOLAMINE 1 MG/3DAYS TD PT72
1.0000 | MEDICATED_PATCH | TRANSDERMAL | Status: DC
  Administered 2020-02-08 – 2020-02-11 (×2): 1.5 mg via TRANSDERMAL
  Filled 2020-02-08 (×2): qty 1

## 2020-02-08 MED ORDER — PROMETHAZINE HCL 25 MG/ML IJ SOLN
12.5000 mg | Freq: Four times a day (QID) | INTRAMUSCULAR | Status: DC | PRN
  Administered 2020-02-08 (×2): 25 mg via INTRAVENOUS
  Administered 2020-02-09: 12.5 mg via INTRAVENOUS
  Administered 2020-02-09 – 2020-02-11 (×8): 25 mg via INTRAVENOUS
  Filled 2020-02-08 (×11): qty 1

## 2020-02-08 NOTE — Progress Notes (Signed)
Patient had multiple emesis tonight. MD aware.

## 2020-02-08 NOTE — Progress Notes (Signed)
Received a page about patient BP 164/108. Elevated last night as well. This is likely 2/2 vomiting as she was previously normotensive even with home BP meds held. Will hold off on medications for now, replete fluids as necessary once labs return this AM. Can consider adding back home medication if patient has persistent elevations throughout the day.

## 2020-02-08 NOTE — Progress Notes (Signed)
Family Medicine Teaching Service Daily Progress Note Intern Pager: [phone]  Patient name: Amy Heath Medical record number: [id number] Date of birth: Jan 23, 1981 Age: 39 y.o. Gender: female  Primary Care Provider: [facility] (Inactive) Consultants: GI Code Status: FULL  Pt Overview and Major Events to Date:  Admitted 7/16  Assessment and Plan: Amy Riddleis a 39 y.o.femalepresenting with R sided abdominal pain, vomiting, and diarrhea.PMH is significant for recurrent episodes of nausea/vomiting, IBS-D, sickle cell trait, recent norovirus illness in 12/2019.  Acute recurrent intractable N/Vwith abdominal pain. Patient had at least 7 episodes of emesis overnight and 1 dose of IV Reglan. And 1 in the morning during interview. Vomit went from yellow to green during the morning episode and appeared bilious. Patient did not tolerate advancing her diet. We will restart IV phenergan and IV Reglan.  We will reevaluate the patient this afternoon and consider restarting PO anti-emetics overnight and continue Liquid. Electrolytes remain stable.Cr. 0.86. Hepatic panel ordered. Patient would like to be NPO right now, we will leave clear liquid diet to prevent dehydration as we will not restart mIVF. -Clear liquid diet - F/u Hepatic panel -Protonix40 BID - continue mIVF 20 to keep line open -Completed azithromycin for Campylobacter: 500mg  IV x 3d, completed 7/20 - IV Phenergan  q 6 PRN  -TylenolPRNabdominal pain, will avoid opioids if possible -Encourage complete avoidance of THC -Maintain GI follow-up in 03/2020 - Continue scopolamine patch  Diarrhea:Acute on chronic,improved. No diarrhea overnight. -Clear liquid diet, -Azithromycin x3 days, completed 7/20 -Maintain GI follow-up  Hypokalemia:Recurrent. K+4.4,today.  -Monitor BMP - Replenish as necessary  ZYS:AYTKZS. Urine cx no growth to date. Patient reports increased frequency, but has had  mIVF running until 7/21. She has no other urinary concerns. -Follow-up urine culture  Hypertension: Chronic, stable. Patient has an AM BP 164/108 but likely 2/2 increased vomiting as patient has been normotensive. At this time we will try to to treat the excessive emesis before adding the home med back.  -Will hold home Norvasc while current GI losses,add back if needed - Monitor BP  Headaches:Chronic, stable. Asymptomatic currently. -Tylenol as needed  Mild asthma: Chronic, stable. -Albuterol as needed  Marijuana Use Endorses no use in the past 2.5 months, however UDS+THC. -Encourage complete cessation    FEN/GI: Full Liquids PPx: Lovenox  Disposition: Home  Subjective:  Patient had emesis at least 7 times overnight. Patient required 2 additional doses of Reglan on top of her ac and hs doses. Patient obviously did not handle advancing her diet well. During interview today the patient began vomiting bilious fluid. Patient was able to get to the bathroom with assistance and vomit in the trashcan. Patient is obviously upset that she is vomiting again. She did report that she has not had diarrhea in at least 12 hrs. She was trying to advance her diet and had mash potatoes and hot chocolate for dinner, but was not able to keep it down. Patient also reported clear urine, but with increased frequency.  Objective: Temp:  [98.5 F (36.9 C)-99.1 F (37.3 C)] 98.5 F (36.9 C) (07/22 0410) Pulse Rate:  [83-99] 99 (07/22 0410) Resp:  [16-18] 18 (07/22 0410) BP: (116-164)/(79-108) 164/108 (07/22 0410) SpO2:  [100 %] 100 % (07/22 0410) Physical Exam: General: Patient is in distress noticeably crossing her legs and holding her abdomen in response to pain and began vomiting in the middle of the exam. Cardiovascular: Tachycardic regular rhythm, no murmur detected Respiratory: Clear and equal to bilateral  auscult. Abdomen: Pain with and without palpation. Patient actively vomiting  bilious, green fluids, no diarrhea Extremities: Distal pulses intact.No edema.  Laboratory: Recent Labs  Lab 02/04/20 0200 02/05/20 0252 02/06/20 0202  WBC 5.8 8.0 6.2  HGB 9.5* 10.8* 9.7*  HCT 29.2* 33.0* 29.2*  PLT 274 312 286   Recent Labs  Lab 02/02/20 1134 02/02/20 1134 02/03/20 0418 02/04/20 0200 02/05/20 0252 02/06/20 0202 02/07/20 0145  NA 146*   < > 142   < > 141 141 139  K 3.4*   < > 3.3*   < > 3.6 3.3* 3.3*  CL 112*   < > 108   < > 111 109 108  CO2 22   < > 24   < > 18* 23 24  BUN 15   < > 10   < > 5* <5* <5*  CREATININE 1.07*   < > 0.78   < > 0.82 0.87 0.85  CALCIUM 9.1   < > 8.4*   < > 8.6* 8.4* 8.7*  PROT 8.0  --  6.7  --   --   --   --   BILITOT 0.7  --  1.0  --   --   --   --   ALKPHOS 77  --  65  --   --   --   --   ALT 18  --  16  --   --   --   --   AST 25  --  22  --   --   --   --   GLUCOSE 125*   < > 100*   < > 86 88 87   < > = values in this interval not displayed.      Imaging/Diagnostic Tests:  Freida Busman, MD 02/08/2020, 6:58 AM PGY-1, [place] Intern pager: [phone], text pages welcome

## 2020-02-08 NOTE — Progress Notes (Signed)
FPTS Interim Progress Note  S:Patient continues to have emesis there are small flecks of light blood in the vomit. Nurse was in room and does not report seeing the blood but does report that the patient has not been tolerating PO meds. Patient continues to have abdominal pain.  O: BP (!) 164/108 (BP Location: Right Arm)   Pulse 99   Temp 98.5 F (36.9 C)   Resp 18   Ht 5\' 6"  (1.676 m)   Wt 74.8 kg   LMP 02/01/2020   SpO2 100%   BMI 26.63 kg/m   General: Patient is up taking a shower CV: Tachycardic with regular rhythm an no murmur Resp: Clear and equal to bil. Auscl. GI: Pain with and without palpation, normoactive bowel sounds  A/P: At this time we will continue IV anti-emetics and Fluid diet.  Freida Busman, MD 02/08/2020, 4:35 PM PGY-1, [place] Medicine Service pager [phone]

## 2020-02-09 LAB — RENAL FUNCTION PANEL
Albumin: 3.6 g/dL (ref 3.5–5.0)
Anion gap: 13 (ref 5–15)
BUN: 7 mg/dL (ref 6–20)
CO2: 23 mmol/L (ref 22–32)
Calcium: 9.5 mg/dL (ref 8.9–10.3)
Chloride: 106 mmol/L (ref 98–111)
Creatinine, Ser: 0.77 mg/dL (ref 0.44–1.00)
GFR calc Af Amer: >60 mL/min (ref >60)
GFR calc non Af Amer: >60 mL/min (ref >60)
Glucose, Bld: 101 mg/dL — ABNORMAL HIGH (ref 70–99)
Phosphorus: 4.5 mg/dL (ref 2.5–4.6)
Potassium: 3 mmol/L — ABNORMAL LOW (ref 3.5–5.1)
Sodium: 142 mmol/L (ref 135–145)

## 2020-02-09 LAB — CBC WITH DIFFERENTIAL/PLATELET
Abs Immature Granulocytes: 0.04 10*3/uL (ref 0.00–0.07)
Basophils Absolute: 0 10*3/uL (ref 0.0–0.1)
Basophils Relative: 0 %
Eosinophils Absolute: 0.1 10*3/uL (ref 0.0–0.5)
Eosinophils Relative: 1 %
HCT: 33 % — ABNORMAL LOW (ref 36.0–46.0)
Hemoglobin: 11.2 g/dL — ABNORMAL LOW (ref 12.0–15.0)
Immature Granulocytes: 0 %
Lymphocytes Relative: 19 %
Lymphs Abs: 1.8 10*3/uL (ref 0.7–4.0)
MCH: 28.2 pg (ref 26.0–34.0)
MCHC: 33.9 g/dL (ref 30.0–36.0)
MCV: 83.1 fL (ref 80.0–100.0)
Monocytes Absolute: 0.8 10*3/uL (ref 0.1–1.0)
Monocytes Relative: 9 %
Neutro Abs: 6.4 10*3/uL (ref 1.7–7.7)
Neutrophils Relative %: 71 %
Platelets: 315 10*3/uL (ref 150–400)
RBC: 3.97 MIL/uL (ref 3.87–5.11)
RDW: 13.7 % (ref 11.5–15.5)
WBC: 9.2 10*3/uL (ref 4.0–10.5)
nRBC: 0 % (ref 0.0–0.2)

## 2020-02-09 MED ORDER — METOCLOPRAMIDE HCL 5 MG/ML IJ SOLN
10.0000 mg | Freq: Four times a day (QID) | INTRAMUSCULAR | Status: DC
  Administered 2020-02-09 – 2020-02-11 (×6): 10 mg via INTRAVENOUS
  Filled 2020-02-09 (×6): qty 2

## 2020-02-09 MED ORDER — SODIUM CHLORIDE 0.9 % IV SOLN: INTRAVENOUS | Status: DC

## 2020-02-09 MED ORDER — ACETAMINOPHEN 650 MG RE SUPP
650.0000 mg | Freq: Four times a day (QID) | RECTAL | Status: DC | PRN
  Administered 2020-02-09 – 2020-02-11 (×3): 650 mg via RECTAL
  Filled 2020-02-09 (×3): qty 1

## 2020-02-09 MED ORDER — POTASSIUM CHLORIDE 10 MEQ/100ML IV SOLN
10.0000 meq | INTRAVENOUS | Status: AC
  Administered 2020-02-09 (×2): 10 meq via INTRAVENOUS
  Filled 2020-02-09 (×3): qty 100

## 2020-02-09 MED ORDER — PROMETHAZINE HCL 25 MG/ML IJ SOLN: 12.5000 mg | Freq: Four times a day (QID) | INTRAMUSCULAR | Status: DC

## 2020-02-09 MED ORDER — ACETAMINOPHEN 325 MG PO TABS: 650.0000 mg | ORAL_TABLET | ORAL | Status: DC | PRN

## 2020-02-09 NOTE — Progress Notes (Signed)
Pt tried to take PO meds crushed with apple sauce. Pt threw them up after a few hours.

## 2020-02-09 NOTE — Progress Notes (Addendum)
Daily Rounding Note  02/09/2020, 2:31 PM  LOS: 6 days   SUBJECTIVE:   Chief complaint: persistent N/V    TS unable to discharge pt due to persistent non-bloody emesis, nausea.   Current meds include scopolamine patch,  Protonix 40 p.o. bid, famotidine 20 mg/day.  Scheduled IV Reglan, and scheduled IV Phenergan finished 7/21.  Has been receiving as needed IV Phenergan since then. Dilaudid discontinued, last dose 7/21. Topical capsaicin did not work.  Pt was doing better and scheduled ani-emetics stopped, solid food re-started at which point she regressed to current point of vomiting up oral meds w applesauce. Asking for Tylenol for pain of headache and abdomen.    Pt told RN she had vomited this AM but this day shift RN has not observed any N/V.  RN feels like pt is ready for discharge.  Pt getting run of potassium for K of 3. Having BM's, less frequent but not bloody etc.     OBJECTIVE:         Vital signs in last 24 hours:    Temp:  [97.5 F (36.4 C)-98.8 F (37.1 C)] 98.5 F (36.9 C) (07/23 1351) Pulse Rate:  [81-102] 81 (07/23 1351) Resp:  [16-18] 16 (07/23 1351) BP: (114-120)/(79-81) 120/81 (07/23 1351) SpO2:  [100 %] 100 % (07/23 1351) Last BM Date: 02/09/20 Filed Weights   02/03/20 1700  Weight: 74.8 kg   General: looks well, comfortable   Heart: RRR Chest: clear w/o labored breathing Abdomen: soft, NT, hypoactive BS.  Extremities: no CCE Neuro/Psych:  Oriented x 3.  Fluid speech.  Calm.  Not drowsy or anxious.    Intake/Output from previous day: 07/22 0701 - 07/23 0700 In: 340 [P.O.:340] Out: 200 [Emesis/NG output:200]  Intake/Output this shift: No intake/output data recorded.  Lab Results: Recent Labs    02/08/20 1439 02/09/20 0142  WBC 8.8 9.2  HGB 11.6* 11.2*  HCT 34.7* 33.0*  PLT 327 315   BMET Recent Labs    02/07/20 0145 02/08/20 0610 02/09/20 0142  NA 139 138 142  K 3.3* 4.4 3.0*   CL 108 103 106  CO2 24 20* 23  GLUCOSE 87 127* 101*  BUN <5* <5* 7  CREATININE 0.85 0.86 0.77  CALCIUM 8.7* 9.8 9.5   LFT Recent Labs    02/07/20 0145 02/08/20 1439 02/09/20 0142  PROT  --  7.2  --   ALBUMIN 3.2* 3.8 3.6  AST  --  14*  --   ALT  --  16  --   ALKPHOS  --  68  --   BILITOT  --  1.1  --   BILIDIR  --  <0.1  --   IBILI  --  NOT CALCULATED  --    PT/INR No results for input(s): LABPROT, INR in the last 72 hours. Hepatitis Panel No results for input(s): HEPBSAG, HCVAB, HEPAIGM, HEPBIGM in the last 72 hours.  Studies/Results: No results found.  Scheduled Meds: . enoxaparin (LOVENOX) injection  40 mg Subcutaneous QHS  . famotidine  20 mg Oral Daily  . mirtazapine  15 mg Oral QHS  . pantoprazole  40 mg Oral BID  . scopolamine  1 patch Transdermal Q72H  . sodium chloride flush  3 mL Intravenous Once   Continuous Infusions: . sodium chloride 125 mL/hr at 02/09/20 1005   PRN Meds:.acetaminophen, promethazine   ASSESMENT:   *   Acute on chronic nausea, vomiting, cyclic vomiting  syndrome.  Problem for many years. History GERD, history cannabinoid hyperemesis.  Also consider Migraine equivalent/ abdominal migraine.   11/2017 EGD with small hiatal hernia, mild esophagitis. 5 weeks ago admission with norovirus. Campylobacter detected on stool PCR and BCV with Clue clells per wet prep this admission.  Treated with Keflex, IV Zithromax. Lap Cholecystectomy 2014.   Tox screen again positive for THC. Dr. Mitchell Heir in [facility] is her GI doctor.  Also seen at [facility].    *   SS trait, anemia.  Hb stable.  *   Hypokalemia.  2 Runs IV potassium infused today.    PLAN   *   Restart scheduled Reglan and Phenergan IV, once improved, switch to oral forms.    Tylenol PR prn orders in place.    Per review of cyclic vomiting syndrome in Up to Date, may be worth trialing Amitryptilline 75 to 100 mg daily or Topiramate 100 mg BID    *  Pt likely to continue to have  intermittent N/V.    *   At discharge should return to her GI in [place]: Ti Le MD or to  [facility] Dr Doreene Nest and Dr Kathreen Devoid where they can arrange outpt 4 hour GES and hve availability of specialized GI motility expertise, Dr Scherrie November.      Amy Heath  02/09/2020, 2:31 PM Phone [phone]  GI ATTENDING  Interval history and data reviewed.  Agree with interval progress note.  Reconsultation regarding chronic nausea and vomiting which she has had for years.  As previously discussed no objective GI disorder has been diagnosed.  May be cannabinoid related.  May be functional in nature.  I am glad you stopped the narcotics.  Okay to run antinausea medicines.  They could be given an oral form as well she could be discharged.  She should follow-up with her primary GI team.  Nothing further to add.  We will sign off.  Docia Chuck. Geri Seminole., M.D. [facility] Division of Gastroenterology

## 2020-02-09 NOTE — Progress Notes (Signed)
Family Medicine Teaching Service Daily Progress Note Intern Pager: [phone]  Patient name: Amy Heath Medical record number: [id number] Date of birth: May 16, 1981 Age: 39 y.o. Gender: female  Primary Care Provider: [facility] (Inactive) Consultants: GI Code Status: FULL  Pt Overview and Major Events to Date:  Admitted 7/16  Assessment and Plan: Amy Riddleis a 39 y.o.femalepresenting with R sided abdominal pain, vomiting, and diarrhea.PMH is significant for recurrent episodes of nausea/vomiting, IBS-D, sickle cell trait, recent norovirus illness in 12/2019.  Acute recurrent intractable N/Vwith abdominal pain. Patient continues to have emesis, although decreased number of episodes from yesterday. The patient reported that the emesis was only yellow stomach acids. She has failed her PO trial and is unable to keep liquids and medications down. GI is being reconsulted. At this time we will place patient on bowel rest with NPO. Hepatic panel resulted WNL; with low HDL decreasing concern for hepatic obstruction. K+ 3.0. Na 142. Cr. .77. -NPO - GI consult -Protonix40 BID - restart mIVF 125 -Completedazithromycin for Campylobacter: 500mg  IV x 3d, completed 7/20 - IV Phenergan  q 6 PRN  -TylenolPRNabdominal pain, will avoid opioids if possible -Encourage complete avoidance of THC -Maintain GI follow-up in 03/2020 - Continue scopolamine patch  Diarrhea:Acute on chronic,improved. 2 episodes of diarrhea, continues to look like emesis. -Azithromycin x3 days,completed 7/20 -Maintain GI follow-up  Hypokalemia:Recurrent. K+3.0,today.  -Monitor BMP - Ordered IV K+ - Replenish as necessary  NOM:VEHMCN. Urine cx no growthto date. Patient reports increased frequency. She has no other urinary concerns. -Follow-up urine culture  Hypertension: Chronic, stable. Patient has an AM BP 114/79. At this time we will try to to treat the excessive  emesis before adding the home med back.  -Will hold home Norvasc while current GI losses,add back if needed - Monitor BP  Headaches:Chronic, stable. Asymptomatic currently. -Tylenol as needed  Mild asthma: Chronic, stable. -Albuterol as needed  Marijuana Use Endorses no use in the past 2.5 months, however UDS+THC. -Encourage complete cessation    FEN/GI: Full Liquids PPx: Lovenox  Disposition: Home  Subjective:  Patient failed PO trials yesterday afternoon and this morning. Patient is unable to keep medications down. No overnight events. Patient continues to have abdominal pain and did have 2 episode of diarrhea that resembled stomach acid per patient.  Objective: Temp:  [97.5 F (36.4 C)-98.8 F (37.1 C)] 97.5 F (36.4 C) (07/23 0526) Pulse Rate:  [87-102] 87 (07/23 0526) Resp:  [17-18] 17 (07/23 0526) BP: (114)/(79) 114/79 (07/23 0526) SpO2:  [100 %] 100 % (07/23 0526) Physical Exam: General: Patient sitting in bed some discomfort Cardiovascular: Regular rate and rhythm, no murmur detected Respiratory: Clear and equal to bil. auscl Abdomen: Pain in the LUQ and LLQ, normoactive bowel sounds Extremities: No edema noted and distal pulses intact.  Laboratory: Recent Labs  Lab 02/06/20 0202 02/08/20 1439 02/09/20 0142  WBC 6.2 8.8 9.2  HGB 9.7* 11.6* 11.2*  HCT 29.2* 34.7* 33.0*  PLT 286 327 315   Recent Labs  Lab 02/02/20 1134 02/02/20 1134 02/03/20 0418 02/04/20 0200 02/07/20 0145 02/08/20 0610 02/08/20 1439 02/09/20 0142  NA 146*   < > 142   < > 139 138  --  142  K 3.4*   < > 3.3*   < > 3.3* 4.4  --  3.0*  CL 112*   < > 108   < > 108 103  --  106  CO2 22   < > 24   < >  24 20*  --  23  BUN 15   < > 10   < > <5* <5*  --  7  CREATININE 1.07*   < > 0.78   < > 0.85 0.86  --  0.77  CALCIUM 9.1   < > 8.4*   < > 8.7* 9.8  --  9.5  PROT 8.0  --  6.7  --   --   --  7.2  --   BILITOT 0.7  --  1.0  --   --   --  1.1  --   ALKPHOS 77  --  65  --   --    --  68  --   ALT 18  --  16  --   --   --  16  --   AST 25  --  22  --   --   --  14*  --   GLUCOSE 125*   < > 100*   < > 87 127*  --  101*   < > = values in this interval not displayed.      Imaging/Diagnostic Tests:   Freida Busman, MD 02/09/2020, 8:06 AM PGY-1, [place] Intern pager: [phone], text pages welcome

## 2020-02-09 NOTE — Progress Notes (Addendum)
Family Medicine Teaching Service Daily Progress Note Intern Pager: [phone]  Patient name: Amy Heath Medical record number: [id number] Date of birth: 1981-03-24 Age: 39 y.o. Gender: female  Primary Care Provider: [facility] (Inactive) Consultants: GI  Code Status: FULL  Pt Overview and Major Events to Date:  Admitted 7/16  Assessment and Plan: Labella Riddleis a 39 y.o.femalepresenting with R sided abdominal pain, vomiting, and diarrhea.PMH is significant for recurrent episodes of nausea/vomiting, IBS-D, sickle cell trait, recent norovirus illness in 12/2019.  Acute recurrent intractable N/Vwith abdominal pain. Patient has not vomited since early yesterday morning. Does admit to a small amount of blood tinged vomit yesterday. She previously failed her PO trial so was kept NPO yesterday. Plan today would be to increase to liquids to see if she is able to tolerate, patient is agreeable with this. GI has signed off, recommended to continue antiemetics and possibly discharge with PO antiemetics.  - S/p azithromycin for Campylobacter: 500mg  IV x 3d, completed 7/20 - Advance diet to sips of clear liquids today - Continue Protonix40 BID - restart mIVF125 - IV Phenerganq6h PRNnausea, vomiting -TylenolPRNabdominal pain, will avoid opioids if possible -Continue to encourage complete avoidance of THC -Maintain GI follow-up in 03/2020 - Continue scopolamine patch  Diarrhea:Acute on chronic,improved. States she had two episodes yesterday. No blood.  - S/p Azithromycin x3 days,completed 7/20 - Follow up with GI outpatient  Hypokalemia:Recurrent. Labs not yet completed this AM. -Monitor BMP - Replenish as necessary  Cystitis:Stable, resolved. Urine cx did not show any growth. No further complaints of urinary symptoms.  Hypertension: Chronic, stable. Patient has an AM BP 108/67.  - Continue to hold home Norvasc for now - Monitor  BP  Headaches:Chronic, stable. Asymptomatic currently. -Tylenol as needed  Mild asthma: Chronic, stable. - Albuterol as needed  Marijuana Use Endorses no use in the past 2.5 months, however UDS+THC. - Continue to encourage complete cessation  FEN/GI: Full liquids as tolerated PPx: Lovenox  Disposition: Home once able to tolerate PO   Subjective:  Patient has not vomited since early yesterday morning. At that time she recalls a small amount of blood-tinged vomit. She is currently feeling nauseous. She does believe that she will be able to hand a diet advancement to liquids today. She tells me that she is eager to get home. Two episodes of diarrhea yesterday, no blood. She continues to have diffuse abdominal pain but worse in epigastric region.   Objective: Temp:  [97.5 F (36.4 C)-98.7 F (37.1 C)] 98.7 F (37.1 C) (07/23 2029) Pulse Rate:  [81-87] 86 (07/23 2029) Resp:  [16-17] 17 (07/23 2029) BP: (107-120)/(78-81) 107/78 (07/23 2029) SpO2:  [100 %] 100 % (07/23 2029) Physical Exam: General: Awake, alert, pleasant Cardiovascular: RRR Respiratory: CTAB Abdomen: diffuse tenderness in all quadrants to light palpation, worse in epigastric region, normoactive BS Extremities: No edema, 2+PT pulses  Laboratory: Recent Labs  Lab 02/06/20 0202 02/08/20 1439 02/09/20 0142  WBC 6.2 8.8 9.2  HGB 9.7* 11.6* 11.2*  HCT 29.2* 34.7* 33.0*  PLT 286 327 315   Recent Labs  Lab 02/03/20 0418 02/04/20 0200 02/07/20 0145 02/08/20 0610 02/08/20 1439 02/09/20 0142  NA 142   < > 139 138  --  142  K 3.3*   < > 3.3* 4.4  --  3.0*  CL 108   < > 108 103  --  106  CO2 24   < > 24 20*  --  23  BUN 10   < > <  5* <5*  --  7  CREATININE 0.78   < > 0.85 0.86  --  0.77  CALCIUM 8.4*   < > 8.7* 9.8  --  9.5  PROT 6.7  --   --   --  7.2  --   BILITOT 1.0  --   --   --  1.1  --   ALKPHOS 65  --   --   --  68  --   ALT 16  --   --   --  16  --   AST 22  --   --   --  14*  --    GLUCOSE 100*   < > 87 127*  --  101*   < > = values in this interval not displayed.    Imaging/Diagnostic Tests: CT Abdomen/Pelvis 7/16 IMPRESSION: No acute intra-abdominal or pelvic pathology to explain the patient's symptoms.  Sharion Settler, DO 02/09/2020, 11:56 PM PGY-1, [place] Intern pager: [phone], text pages welcome

## 2020-02-10 LAB — RENAL FUNCTION PANEL
Albumin: 3 g/dL — ABNORMAL LOW (ref 3.5–5.0)
Anion gap: 9 (ref 5–15)
BUN: 6 mg/dL (ref 6–20)
CO2: 23 mmol/L (ref 22–32)
Calcium: 8.7 mg/dL — ABNORMAL LOW (ref 8.9–10.3)
Chloride: 107 mmol/L (ref 98–111)
Creatinine, Ser: 0.82 mg/dL (ref 0.44–1.00)
GFR calc Af Amer: >60 mL/min (ref >60)
GFR calc non Af Amer: >60 mL/min (ref >60)
Glucose, Bld: 94 mg/dL (ref 70–99)
Phosphorus: 3.4 mg/dL (ref 2.5–4.6)
Potassium: 3.2 mmol/L — ABNORMAL LOW (ref 3.5–5.1)
Sodium: 139 mmol/L (ref 135–145)

## 2020-02-10 LAB — CBC WITH DIFFERENTIAL/PLATELET
Abs Immature Granulocytes: 0.01 10*3/uL (ref 0.00–0.07)
Basophils Absolute: 0 10*3/uL (ref 0.0–0.1)
Basophils Relative: 0 %
Eosinophils Absolute: 0.3 10*3/uL (ref 0.0–0.5)
Eosinophils Relative: 6 %
HCT: 31.5 % — ABNORMAL LOW (ref 36.0–46.0)
Hemoglobin: 10.6 g/dL — ABNORMAL LOW (ref 12.0–15.0)
Immature Granulocytes: 0 %
Lymphocytes Relative: 43 %
Lymphs Abs: 2.3 10*3/uL (ref 0.7–4.0)
MCH: 28.6 pg (ref 26.0–34.0)
MCHC: 33.7 g/dL (ref 30.0–36.0)
MCV: 85.1 fL (ref 80.0–100.0)
Monocytes Absolute: 0.7 10*3/uL (ref 0.1–1.0)
Monocytes Relative: 13 %
Neutro Abs: 2.1 10*3/uL (ref 1.7–7.7)
Neutrophils Relative %: 38 %
Platelets: 298 10*3/uL (ref 150–400)
RBC: 3.7 MIL/uL — ABNORMAL LOW (ref 3.87–5.11)
RDW: 13.9 % (ref 11.5–15.5)
WBC: 5.6 10*3/uL (ref 4.0–10.5)
nRBC: 0 % (ref 0.0–0.2)

## 2020-02-10 MED ORDER — POTASSIUM CHLORIDE 10 MEQ/100ML IV SOLN
10.0000 meq | INTRAVENOUS | Status: AC
  Administered 2020-02-10 (×4): 10 meq via INTRAVENOUS
  Filled 2020-02-10 (×4): qty 100

## 2020-02-10 NOTE — Plan of Care (Signed)

## 2020-02-10 NOTE — Progress Notes (Signed)
MD changed patient to clear liquid diet.  MD would like patient to take slow and small sips.  Will pass information to patient and day shift nurse.

## 2020-02-11 LAB — CBC
HCT: 29.4 % — ABNORMAL LOW (ref 36.0–46.0)
Hemoglobin: 10 g/dL — ABNORMAL LOW (ref 12.0–15.0)
MCH: 28.9 pg (ref 26.0–34.0)
MCHC: 34 g/dL (ref 30.0–36.0)
MCV: 85 fL (ref 80.0–100.0)
Platelets: 288 10*3/uL (ref 150–400)
RBC: 3.46 MIL/uL — ABNORMAL LOW (ref 3.87–5.11)
RDW: 13.8 % (ref 11.5–15.5)
WBC: 6.8 10*3/uL (ref 4.0–10.5)
nRBC: 0 % (ref 0.0–0.2)

## 2020-02-11 MED ORDER — MIRTAZAPINE 15 MG PO TBDP: 15.0000 mg | ORAL_TABLET | Freq: Every day | ORAL | 0 refills | Status: AC

## 2020-02-11 MED ORDER — PROMETHAZINE HCL 12.5 MG PO TABS: 12.5000 mg | ORAL_TABLET | Freq: Four times a day (QID) | ORAL | 0 refills | Status: AC | PRN

## 2020-02-11 MED ORDER — PROMETHAZINE HCL 25 MG PO TABS: 12.5000 mg | ORAL_TABLET | Freq: Four times a day (QID) | ORAL | Status: DC | PRN

## 2020-02-11 NOTE — Hospital Course (Addendum)
Mrs. Bernick is a 39 yo female who presented for intractable vomiting, acute onset diarrhea, nausea, and abdominal pain. Patient has a PMH of recurrent episodes of nausea/vomiting, IBS-D, sickle cell trait, recent norovirus illness with hospitalization  in 12/2019.  The following is the patient hospital course organized by problem.  Acute on recurrent intractable N/V with abdominal pain Patient presented with yellow emesis resembling stomach acid. Hepatic panel was WNL with a low HDL. GI was consulted suspected that the patient was having an acute episode of her chronic intractable vomiting. They also suspected that the intractable vomiting is cyclical and induced by cannabinoid use vs functional issue. Patient was treated with Scopolamine, Bentyl and IV antiemetics. Patient's abdominal pain was likely due to constant retching and was treated with Oxy which was eventually deescalated to Tylenol. Diet was advanced, but patient was unable to tolerate. Continued PO meds and deescalated diet, but patient remained unable to tolerate. Placed patient on bowel rest and IV anti- emetics, emesis decreased. In response, advanced diet to clear liquids again with and success and past PO meds trial.    Diarrhea GI panel was positive for Campylobacter. Patient was treated with 3 day course of Azithromycin. Diarrheal symptoms decreased over hospital stay. C. Diff was negative.  Hypokalemia: Recurrent. Patient's K was 3.4 at admission. K was replenished multiple times with adequate response each time. K+ wax and waned between hypovolemia and normal range until patient's diarrhea and emesis significantly decreased.  Cystitis: Stable, resolved. Patient reported increased frequency, urgency, and suprapubic abdominal pain to palpation at presentation.  U/a with many bacteria, small leukocytes. Patient received 2 days Keflex, but this was discontinued when Campylobacter tx was started. Patient reported decreased frequency by  discharge.  Hypertension: Chronic, stable. Held home amlodipine. Patient was normotensive most of the hospitalization. Hypertension was only seen with increased emesis. Treatment of increased emesis led to decrease in BP.  Headaches: Chronic, stable. Patient was asymptomatic until day of discharge complained of new onset, intermittent double vision and headache. Noted that this is common with her migraines. Home medication for migraines was Tylenol PRN and was available. Patient will f/u with PCP.  Mild asthma: Chronic, stable. Patient was asymptomatic during hospitalization. Continued home med albuterol PRN.  Marijuana Use  Patient reported last use approx 2.5 months ago. Urine was positive for cannabinoids at presentation. GI consult suspected possible hyperemesis cannabinoid syndrome. Recommended patient continue cessation of marijuana.

## 2020-02-11 NOTE — Discharge Summary (Addendum)
[facility] Discharge Summary  Patient name: Amy Heath Medical record number: [id number] Date of birth: 06/20/1981 Age: 39 y.o. Gender: female Date of Admission: 02/02/2020  Date of Discharge: 02/11/2020 Admitting Physician: Leeanne Rio, MD  Primary Care Provider: [facility] (Inactive) Consultants: GI  Indication for Hospitalization: Intractable vomiting and diarrhea  Discharge Diagnoses/Problem List:  Acute recurrent intractable N/Vwith abdominal pain Diarrhea Hypokalemia Hypertension Mild asthma Headaches Mild asthma Marijuana Use  Disposition: Home  Discharge Condition: Stable  Discharge Exam:  Blood pressure 115/72, pulse 80, temperature 99 F (37.2 C), temperature source Oral, resp. rate 18, height 5\' 6"  (1.676 m), weight 74.8 kg, last menstrual period 02/01/2020, SpO2 100 %. General: Resting comfortably in bed Cardiovascular: Regular rate and rhythm, no murmur detected Respiratory: Clear and equal bil, no extra work of breathing noted Abdomen: diffusely tender to palpation but reportedly less than previous exams, normoactive bowel sounds Extremities: No edema in BLE, distal pulses intact  Brief Hospital Course:  Amy Heath is a 39 yo female who presented for intractable vomiting, acute onset diarrhea, nausea, and abdominal pain. Patient has a PMH of recurrent episodes of nausea/vomiting, IBS-D, sickle cell trait, recent norovirus illness with hospitalization  in 12/2019.  Acute on recurrent intractable N/V with abdominal pain:  Patient presented with a several day history of intractable nausea and yellow emesis, with inability to maintain hydration at home.  CT ab/pelvis w/o evidence of acute intra-abdominal pathology. Hepatic panel and lipase WNL, no leukocytosis. Last THC use reported 2.5 months ago, UDS positive for THC. GI was consulted, questioned if etiology 2/2 induced by cannabinoid use vs  functional issue. Patient was treated with Scopolamine, Bentyl, mirtazapine, and IV antiemetics. Patient's abdominal pain was likely due to constant retching and was treated with Oxy which was eventually deescalated to Tylenol.  Initially started on clear diet with bowel rest that eventually advanced to soft diet by discharge with intermittently relapsing symptoms during her stay.  On day of discharge, she was able to keep down fluids and solid food with no further emesis > 12 hours.  Discharge home with p.o. antiemetics, Bentyl, and mirtazapine.  Non-bloody diarrhea:  C. Diff was negative. GI panel was positive for Campylobacter. Patient was treated with 3 day course of IV Azithromycin. Diarrheal symptoms decreased over hospital stay.   Issues for Follow Up:  1. F/U with GI outpatient concerning chronic intractable vomiting and abdominal pain, previously established with Dr. Marin Comment, however has appointment to establish with [facility] on 9/1. 2. F/u with PCP for BP monitoring.  Takes Norvasc at home, remained normotensive without need for this during stay.  Continue to assess.   3. Reported migraines during her stay, appeared to be relieved by Tylenol intermittently.  Consider trialing abortive therapy such as sumatriptan.  4. Encourage continued complete cessation of THC.  Significant Procedures: None  Significant Labs and Imaging:  Recent Labs  Lab 02/09/20 0142 02/10/20 0804 02/11/20 0146  WBC 9.2 5.6 6.8  HGB 11.2* 10.6* 10.0*  HCT 33.0* 31.5* 29.4*  PLT 315 298 288   Recent Labs  Lab 02/06/20 0202 02/06/20 0202 02/07/20 0145 02/07/20 0145 02/08/20 0610 02/08/20 0610 02/08/20 1439 02/09/20 0142 02/10/20 0804  NA 141  --  139  --  138  --   --  142 139  K 3.3*   < > 3.3*   < > 4.4   < >  --  3.0* 3.2*  CL 109  --  108  --  103  --   --  106 107  CO2 23  --  24  --  20*  --   --  23 23  GLUCOSE 88  --  87  --  127*  --   --  101* 94  BUN <5*  --  <5*  --  <5*  --   --  7 6   CREATININE 0.87  --  0.85  --  0.86  --   --  0.77 0.82  CALCIUM 8.4*  --  8.7*  --  9.8  --   --  9.5 8.7*  PHOS  --   --  4.1  --   --   --   --  4.5 3.4  ALKPHOS  --   --   --   --   --   --  68  --   --   AST  --   --   --   --   --   --  14*  --   --   ALT  --   --   --   --   --   --  16  --   --   ALBUMIN  --   --  3.2*  --   --   --  3.8 3.6 3.0*   < > = values in this interval not displayed.    02/02/2020- CT Abdomen Pelvis No acute intra-abdominal or pelvic pathology to explain the patient's symptoms.  Results/Tests Pending at Time of Discharge: None  Discharge Medications:  Allergies as of 02/11/2020      Reactions   Tramadol Itching      Medication List    TAKE these medications   acetaminophen 500 MG tablet Commonly known as: TYLENOL Take 1,000 mg by mouth every 6 (six) hours as needed for mild pain.   albuterol 108 (90 Base) MCG/ACT inhaler Commonly known as: VENTOLIN HFA Inhale 2 puffs into the lungs every 4 (four) hours as needed for wheezing or shortness of breath.   amLODipine 5 MG tablet Commonly known as: NORVASC Take 5 mg by mouth daily.   dicyclomine 20 MG tablet Commonly known as: BENTYL Take 1 tablet (20 mg total) by mouth 2 (two) times daily.   fluticasone 50 MCG/ACT nasal spray Commonly known as: FLONASE Place 2 sprays into both nostrils daily as needed for allergies or rhinitis.   mirtazapine 15 MG disintegrating tablet Commonly known as: REMERON SOL-TAB Take 1 tablet (15 mg total) by mouth at bedtime.   omeprazole 20 MG capsule Commonly known as: PRILOSEC Take 20 mg by mouth 2 (two) times daily.   promethazine 12.5 MG tablet Commonly known as: PHENERGAN Take 1 tablet (12.5 mg total) by mouth every 6 (six) hours as needed for nausea or vomiting. What changed:   medication strength  how much to take  reasons to take this       Discharge Instructions: Please refer to Patient Instructions section of EMR for full details.   Patient was counseled important signs and symptoms that should prompt return to medical care, changes in medications, dietary instructions, activity restrictions, and follow up appointments.   Follow-Up Appointments:  Follow-up Information    Schedule an appointment as soon as possible for a visit  with [facility].   Contact information: [address] [place] [address] [phone]        Marin Comment,  Tri [facility]., MD Follow up.   Specialty: Gastroenterology Why: call for follow up of nausea issues.   Contact information: [address] [place] [address] [phone]               Freida Busman, MD 02/11/2020, 3:06 PM PGY-1, [place] Upper-Level Resident Addendum My edits for correction/addition/clarification are added. Please see also any attending notes.    Patriciaann Clan, DO  Family Medicine PGY-3

## 2020-02-11 NOTE — Progress Notes (Signed)
Family Medicine Teaching Service Daily Progress Note Intern Pager: [phone]  Patient name: Amy Heath Medical record number: [id number] Date of birth: 1981/05/30 Age: 39 y.o. Gender: female  Primary Care Provider: [facility] (Inactive) Consultants: GI Code Status: FULL  Pt Overview and Major Events to Date:  Admitted 7/16  Assessment and Plan: Tearia Riddleis a 39 y.o.femalewho has had intractable vomiting during hospitalization and treated for campylobacter induced diarrhea. PMH is significant for recurrent episodes of nausea/vomiting, IBS-D, sickle cell trait, recent norovirus illness in 12/2019.  Acute recurrent intractable N/Vwith abdominal pain.  GI has signed off, recommended to continue antiemetics and discharge with PO antiemetics.Patient has been able to tolerate liquids with no emesis in the past 24hrs. - S/p azithromycin for Campylobacter: 500mg  IV x 3d, completed 7/20 - Continue liquids and gradually introduce soft foods - Continue Protonix40 BID  -Discontinue mIVF125 - PO Phenerganq6h PRNnausea, vomiting -TylenolPRNabdominal pain, will avoid opioids -Continue to encourage complete avoidance of THC -Maintain GI follow-up in 03/2020, appropriate per GI or earlier if patient can reschedule - Continue scopolamine patch  Diarrhea:Acute on chronic,improved. States she had two episodes yesterday. No blood. Reports decreased amount of diarrhea each episode. - S/p Azithromycin x3 days,completed 7/20 - Follow up with GI outpatient  Hypokalemia:Recurrent. K replenished 7/24, no episodes of emesis the past 24 hrs.  Cystitis:Stable, resolved. Urine cx did not show any growth. No further complaints of urinary symptoms.  Hypertension: Chronic, stable. Patient has an AM BP115/72. Range 115/72-124/88. - Continue to hold home Norvasc for now - Monitor BP  Headaches:Chronic, stable. Reports of double vision intermittently,  this is common with her migraines over the past 2 days. Patient will f/u with PCP about concerns. -Tylenol as needed  Mild asthma: Chronic, stable. - Albuterol as needed  Marijuana Use Endorses no use in the past 2.5 months, however UDS+THC. - Continue to encourage complete cessation   FEN/GI: Full Liquids as tolerated PPx: Lovenox  Disposition: Home  Subjective:  Patient had no overnight events and is looking to go home. Patient reports that her diarrhea is also improving as she is having less. Patient has not had emesis in 24 hours on clear liquid diet. Patient continues to have dull abdominal pain, but otherwise has no complaints.  Objective: Temp:  [97.6 F (36.4 C)-99.2 F (37.3 C)] 99 F (37.2 C) (07/25 0609) Pulse Rate:  [80-87] 80 (07/25 0609) Resp:  [16-18] 18 (07/25 0609) BP: (115-124)/(72-88) 115/72 (07/25 0609) SpO2:  [100 %] 100 % (07/25 0092) Physical Exam: General: Resting comfortably in bed Cardiovascular: Regular rate and rhythm, no murmur detected Respiratory: Clear and equal bil, no extra work of breathing noted Abdomen: diffusely tender to palpation but reportedly less than previous exams, normoactive bowel sounds Extremities: No edema in BLE, distal pulses intact  Laboratory: Recent Labs  Lab 02/09/20 0142 02/10/20 0804 02/11/20 0146  WBC 9.2 5.6 6.8  HGB 11.2* 10.6* 10.0*  HCT 33.0* 31.5* 29.4*  PLT 315 298 288   Recent Labs  Lab 02/08/20 0610 02/08/20 1439 02/09/20 0142 02/10/20 0804  NA 138  --  142 139  K 4.4  --  3.0* 3.2*  CL 103  --  106 107  CO2 20*  --  23 23  BUN <5*  --  7 6  CREATININE 0.86  --  0.77 0.82  CALCIUM 9.8  --  9.5 8.7*  PROT  --  7.2  --   --   BILITOT  --  1.1  --   --   ALKPHOS  --  68  --   --   ALT  --  16  --   --   AST  --  14*  --   --   GLUCOSE 127*  --  101* 94     Imaging/Diagnostic Tests:   Freida Busman, MD 02/11/2020, 7:37 AM PGY-1, [place] Intern pager:  [phone], text pages welcome

## 2020-02-11 NOTE — Discharge Instructions (Signed)
Dear Amy Heath,   Thank you so much for allowing [place] to be part of your care!  You were admitted to [facility] for vomiting and diarrhea.  Fortunately after fluids, antibiotics, and antinausea medication this has eased down.  We recommend you continue to take it slow at home, try to stay with clear liquids in small sips and easy foods to digest.   POST-HOSPITAL & CARE INSTRUCTIONS 1. Please make sure you follow-up with your GI specialist outpatient. 2. Please let PCP/Specialists know of any changes that were made.  3. Please see medications section of this packet for any medication changes.   DOCTOR'S APPOINTMENT & FOLLOW UP CARE INSTRUCTIONS  Future Appointments  Date Time Provider [place]  03/20/2020  2:00 PM Cirigliano, Vito V, DO [facility]    RETURN PRECAUTIONS: If you are unable to keep yourself hydrated or keep down any fluids.   Take care and be well!  [facility]  [place], [place] [address] [phone]

## 2020-02-11 NOTE — Progress Notes (Signed)
Patient discharged to home with instructions. 

## 2020-03-20 ENCOUNTER — Encounter: Payer: Self-pay | Admitting: Gastroenterology

## 2020-03-20 ENCOUNTER — Ambulatory Visit (INDEPENDENT_AMBULATORY_CARE_PROVIDER_SITE_OTHER): Payer: Medicaid Other | Admitting: Gastroenterology

## 2020-03-20 VITALS — BP 116/78 | HR 86 | Ht 64.0 in | Wt 186.4 lb

## 2020-03-20 DIAGNOSIS — R1084 Generalized abdominal pain: Secondary | ICD-10-CM | POA: Diagnosis not present

## 2020-03-20 DIAGNOSIS — R197 Diarrhea, unspecified: Secondary | ICD-10-CM | POA: Diagnosis not present

## 2020-03-20 DIAGNOSIS — K2100 Gastro-esophageal reflux disease with esophagitis, without bleeding: Secondary | ICD-10-CM

## 2020-03-20 DIAGNOSIS — R112 Nausea with vomiting, unspecified: Secondary | ICD-10-CM

## 2020-03-20 NOTE — Patient Instructions (Signed)
If you are age 39 or older, your body mass index should be between 23-30. Your Body mass index is 31.99 kg/m. If this is out of the aforementioned range listed, please consider follow up with your Primary Care Provider.  If you are age 53 or younger, your body mass index should be between 19-25. Your Body mass index is 31.99 kg/m. If this is out of the aformentioned range listed, please consider follow up with your Primary Care Provider.   You have been scheduled for a gastric emptying scan at [facility] on 04/02/20 at 9:30 am. Please arrive at least 15 minutes prior to your appointment for registration. Please make certain not to have anything to eat or drink after midnight the night before your test. Hold all stomach medications (ex: Zofran, phenergan, Reglan) 48 hours prior to your test. If you need to reschedule your appointment, please contact radiology scheduling at [phone]. ___________________________________________________________________ A gastric-emptying study measures how long it takes for food to move through your stomach. There are several ways to measure stomach emptying. In the most common test, you eat food that contains a small amount of radioactive material. A scanner that detects the movement of the radioactive material is placed over your abdomen to monitor the rate at which food leaves your stomach. This test normally takes about 4 hours to complete. ___________________________________________________________________  Start Probiotics  See Low FODMAP diet attached.   It was a pleasure to see you today!   Gerrit Heck, D.O.  Low FODMAP Diet: (Fermentable Oligosaccharides, Disaccharides, Monosaccharides, and Polyols) These are short chain carbohydrates and sugar alcohols that are poorly absorbed by the body, resulting in multiple abdominal symptoms, including changes in bowel habits, abdominal pain/discomfort, bloating, abdominal distension, gas, etc.

## 2020-03-20 NOTE — Progress Notes (Signed)
Chief Complaint: Abdominal pain, nausea/vomiting, hospital follow-up  Referring Provider:     Drue Flirt, MD  GI History: 39 year old female with a history of depression, anxiety, sickle cell trait, ovarian cyst, GERD.  Longstanding history of generalized abdominal pain, nausea/vomiting, diarrhea.  Previously followed at [place] and Dr. Truman Hayward in [place].  Was suspected to have cannabinoid hyperemesis syndrome and IBS-D.  Has had extensive evaluation past to include:  -Multiple abdominal imaging studies all unrevealing -03/2004 laparotomy LOA, L ovarian cystectomy/unilateral salpingectomy. (path: acute  On chronic salpingitis,oophoritis, tubo-ovarian abscess and adhesions, corpus hemorrhagicum).   -07/2016 abdominal ultrasound: Cholelithiasis, otherwise unremarkable -08/2016 lap chole. (path: chronic cholecystitis, cholelithiasis, 1 benign reactive lymph node), at [facility].   -11/2016 SBFT: negative, no explanation for NV.   -11/2016 MRI brain for N/V, headaches: Normal non-con study.   -08/2016 EGD,no procedure notes but path: benign gastric, esophageal tissues.   -06/2018 CTAP: hepatic steatosis.  -11/2017 EGD Dr Marin Comment:  LA grade B esophagitis and hiatal hernia. Biopsies of duodenum, stomach, and esophagus were all normal. A GEJ nodule was also biopsied and was normal. -11/2017 Colonoscopy Dr Marin Comment:  Grade 2 internal hemorrhoids and a single 4 mm adenomatous polyp in the descending colon, removed with cold forceps.  No random biopsies.   -Admitted to [facility] 6/7-6/06/2020 with n/v, diarrhea, abdominal pain.  Norovirus detected on stool PCR.  CT with small hiatal hernia otherwise unremarkable. -Readmitted 7/1-25/2021 with similar symptoms.  Stool PCR positive for Campylobacter. +BV.  Repeat CT unremarkable.  UDS positive for THC   HPI:     Amy Heath is a 39 y.o. female referred to the Gastroenterology Clinic for follow-up from recent hospital  admission.  Admitted in 01/2020 with recurrent nausea/vomiting, with 2-3 episodes of nonbloody emesis/day, and up to 5-6 episodes/day during exacerbations.  Also with 4-5 loose stools/day.  Generalized abdominal pain when she has these episodes.  Treated with IV antiemetics, high-dose PPI, H2 RA, scheduled Reglan with improvement.  Discharged with antiemetics, Bentyl, mirtazapine.  Was to follow-up with [facility], and specifically seen by GI motility clinic there, Dr. Derrill Kay, but instead presents for follow-up here.  No improvement with previous trial of topical capsaicin.  Today, she states she continues to have n/v and diarrhea. Generalized abdominal pain with episodes; improves with Bentyl. Takes Phenergan 3-4 times/day. Improves sxs. Sxs had improved for a few weeks after hospital d/c, but started to recur over last week or so.   No marijuana since last hospitalization.   Sxs worse witrh spicy foods, milk, certain juices.   Separately, history of GERD and history of LA Grade B erosive esophagitis and small HH on EGD in 11/2017.  Reflux well controlled with Prilosec 20 mg BID, but stopped taking recently.  Restarted today.  Needs repeat colonoscopy in 2024 for polyp surveillance.   Past Medical History:  Diagnosis Date  . Adenomatous colon polyp   . Chronic headaches   . Chronic nausea   . Chronic vomiting   . Depression   . IBS (irritable bowel syndrome)   . Ovarian cyst   . Pneumonia   . Reflux esophagitis   . Sickle cell trait [facility])      Past Surgical History:  Procedure Laterality Date  . COLONOSCOPY  2018  . ESOPHAGOGASTRODUODENOSCOPY  2018  . LAPAROSCOPIC CHOLECYSTECTOMY  2018  . LAPAROSCOPY N/A 10/11/2012   Procedure: LAPAROSCOPY  OPERATIVE;  Surgeon: Guss Bunde, MD;  Location: [facility];  Service: Gynecology;  Laterality: N/A;  . LAPAROTOMY N/A 10/11/2012   Procedure: EXPLORATORY LAPAROTOMY.  Removal of  Ectopic pregnancy.  ;  Surgeon: Guss Bunde, MD;  Location:  [place] [facility];  Service: Gynecology;  Laterality: N/A;  . LYSIS OF ADHESION N/A 10/11/2012   Procedure: LYSIS OF ADHESION;  Surgeon: Guss Bunde, MD;  Location: [place] [facility];  Service: Gynecology;  Laterality: N/A;  . OVARIAN CYST SURGERY    . UNILATERAL SALPINGECTOMY Right 10/11/2012   Procedure: UNILATERAL SALPINGECTOMY;  Surgeon: Guss Bunde, MD;  Location: [facility];  Service: Gynecology;  Laterality: Right;   Family History  Problem Relation Age of Onset  . COPD Mother   . Colon cancer Maternal Grandmother   . Alcohol abuse Neg Hx   . Esophageal cancer Neg Hx    Social History   Tobacco Use  . Smoking status: Never Smoker  . Smokeless tobacco: Never Used  Vaping Use  . Vaping Use: Never used  Substance Use Topics  . Alcohol use: No  . Drug use: Not Currently    Types: Marijuana   Current Outpatient Medications  Medication Sig Dispense Refill  . acetaminophen (TYLENOL) 500 MG tablet Take 1,000 mg by mouth every 6 (six) hours as needed for mild pain.    Marland Kitchen albuterol (VENTOLIN HFA) 108 (90 Base) MCG/ACT inhaler Inhale 2 puffs into the lungs every 4 (four) hours as needed for wheezing or shortness of breath.    Marland Kitchen amLODipine (NORVASC) 5 MG tablet Take 5 mg by mouth daily.    . fluticasone (FLONASE) 50 MCG/ACT nasal spray Place 2 sprays into both nostrils daily as needed for allergies or rhinitis.    . promethazine (PHENERGAN) 12.5 MG tablet Take 1 tablet (12.5 mg total) by mouth every 6 (six) hours as needed for nausea or vomiting. 20 tablet 0  . dicyclomine (BENTYL) 20 MG tablet Take 1 tablet (20 mg total) by mouth 2 (two) times daily. (Patient not taking: Reported on 03/20/2020) 20 tablet 0  . mirtazapine (REMERON SOL-TAB) 15 MG disintegrating tablet Take 1 tablet (15 mg total) by mouth at bedtime. (Patient not taking: Reported on 03/20/2020) 30 tablet 0  . omeprazole (PRILOSEC) 20 MG capsule Take 20 mg by mouth 2 (two) times daily. (Patient not taking: Reported on 03/20/2020)     No current  facility-administered medications for this visit.   Allergies  Allergen Reactions  . Tramadol Itching     Review of Systems: All systems reviewed and negative except where noted in HPI.     Physical Exam:    Wt Readings from Last 3 Encounters:  03/20/20 186 lb 6 oz (84.5 kg)  02/03/20 165 lb (74.8 kg)  12/28/19 178 lb 12.7 oz (81.1 kg)    BP 116/78   Pulse 86   Ht 5\' 4"  (1.626 m)   Wt 186 lb 6 oz (84.5 kg)   BMI 31.99 kg/m  Constitutional:  Pleasant, in no acute distress. Psychiatric: Normal mood and affect. Behavior is normal. EENT: Pupils normal.  Conjunctivae are normal. No scleral icterus. Neck supple. No cervical LAD. Cardiovascular: Normal rate, regular rhythm. No edema Pulmonary/chest: Effort normal and breath sounds normal. No wheezing, rales or rhonchi. Abdominal: Mild generalized abdominal pain without rebound or guarding.  No peritoneal signs.  Soft, nondistended. Bowel sounds active throughout. There are no masses palpable. No hepatomegaly. Neurological: Alert and oriented to person place and time. Skin:  Skin is warm and dry. No rashes noted.   ASSESSMENT AND PLAN;   1) Nausea/Vomiting -Has had an extensive duration in the past, to include imaging studies, serologic studies, endoscopy.  Has been trialed on multiple medications.  Reports that she has quit smoking marijuana.  Symptoms not consistent with cyclic vomiting syndrome. -Gastric emptying study -Continue Phenergan for now -Continue p.o. intake as tolerated.  Recommend multiple small meals/day with low fat low fiber foods  2) Generalized abdominal pain 3) IBS-D -Trial low FODMAP diet -Continue Bentyl as needed -Can trial course of probiotics, particularly in light of recent antibiotic courses -As above, has had an extensive evaluation that has been largely unrevealing -Taking Remeron as an antidepressant, and therefore if considering adding TCA, SSRI, SNRI for additional neuromodulation benefit  would need to be in conjunction with prescribing physician -Trial fiber supplement  4) GERD with erosive esophagitis -Resume Prilosec as this was previously working -Resume antireflux lifestyle/dietary modifications and avoidance of exacerbating foods  I spent 35 minutes of time, including in depth chart review, independent review of results as outlined above, communicating results with the patient directly, face-to-face time with the patient, coordinating care, and ordering studies and medications as appropriate, and documentation.    Lavena Bullion, DO, FACG  03/20/2020, 2:19 PM   Drue Flirt, MD

## 2020-04-02 ENCOUNTER — Encounter (HOSPITAL_COMMUNITY): Payer: Medicaid Other | Attending: Gastroenterology

## 2021-11-13 ENCOUNTER — Ambulatory Visit: Payer: Medicaid Other | Admitting: Gastroenterology

## 2021-11-27 IMAGING — CT CT ABD-PELV W/ CM
2 of 4 series · 16 of 46 positions shown, 18 images · IV contrast (omnipaque)
Comparison: March 02, 2019

CLINICAL DATA: Abdominal pain

EXAM:
CT ABDOMEN AND PELVIS WITH CONTRAST
TECHNIQUE: Multidetector CT imaging of the abdomen and pelvis was performed
using the standard protocol following bolus administration of
intravenous contrast.
CONTRAST:  100mL OMNIPAQUE IOHEXOL 300 MG/ML  SOLN

[Series 3: abd/ pelvis 5.0 i30f 2 · axial · 0.97mm/px · z∈[+796,+1256]mm · 13 of 102 slices shown, 15 images]
[im 5/102  soft-tissue]
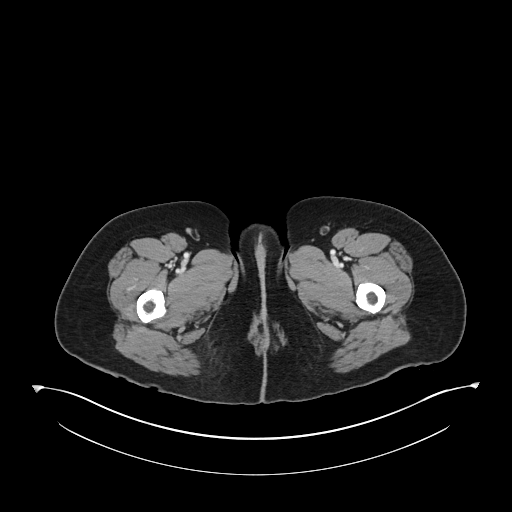
[im 5/102  bone]
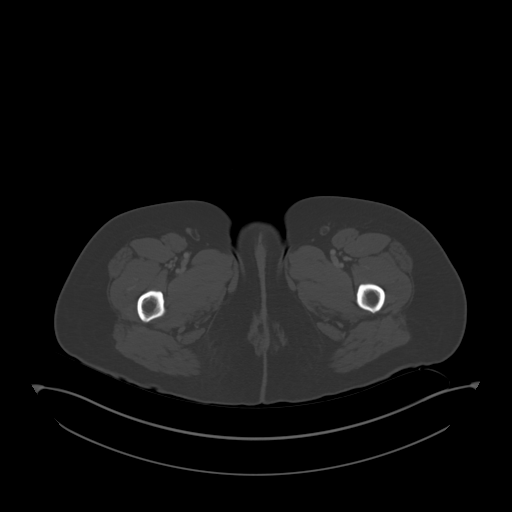
[im 13/102  soft-tissue]
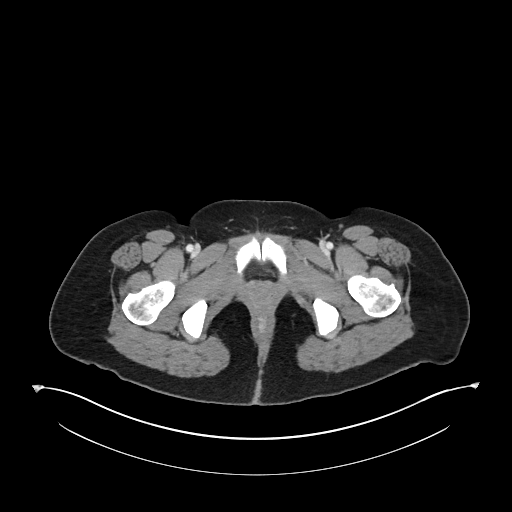
[im 21/102  soft-tissue]
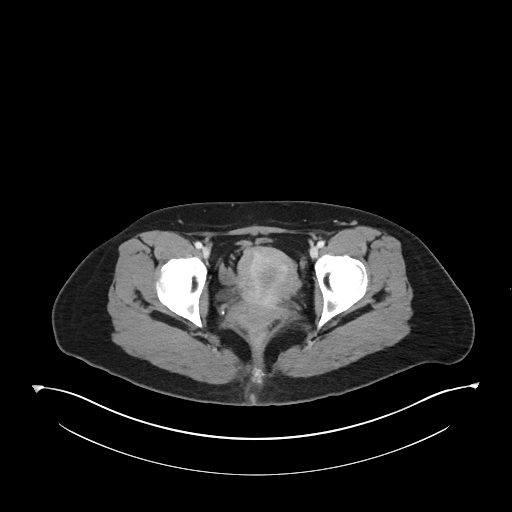
[im 29/102  soft-tissue]
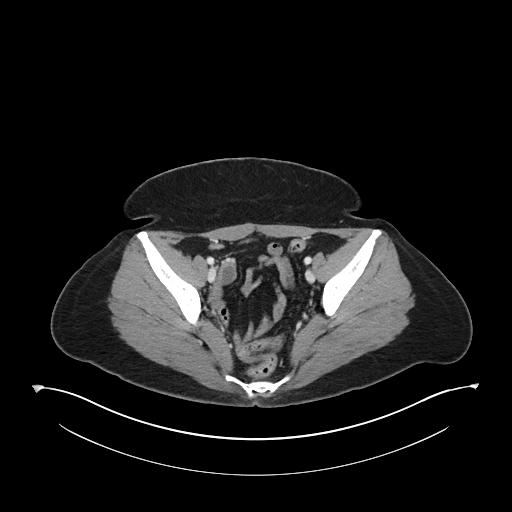
[im 37/102  soft-tissue]
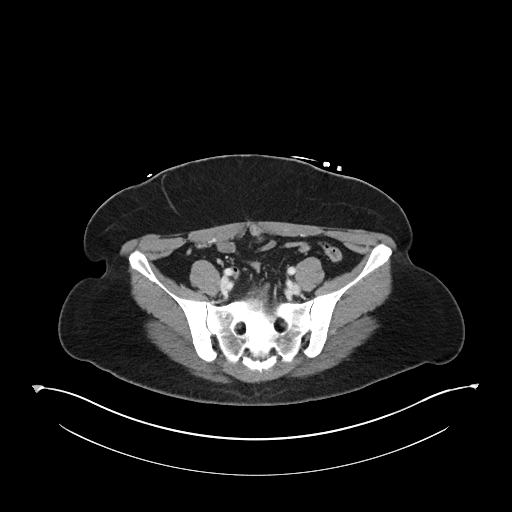
[im 45/102  soft-tissue]
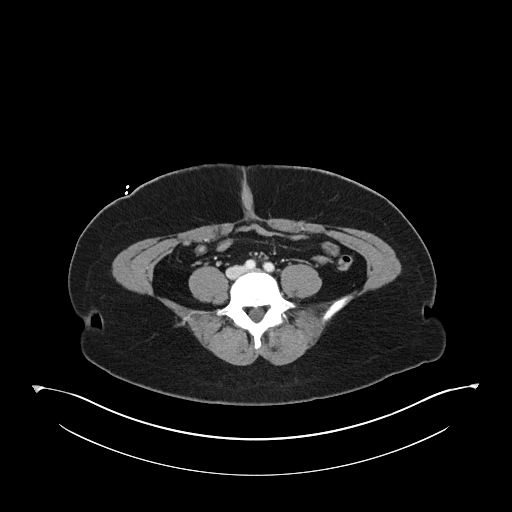
[im 53/102  soft-tissue]
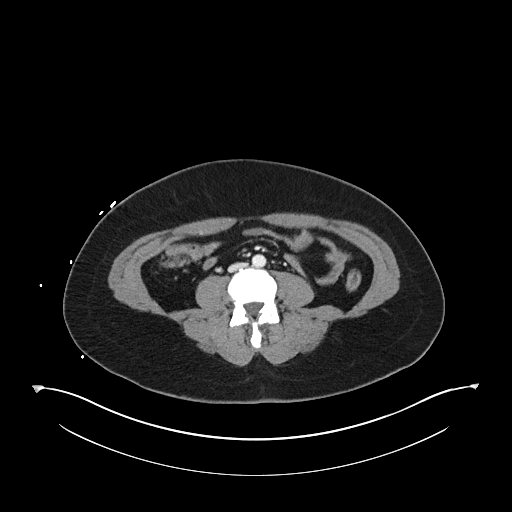
[im 57/102  soft-tissue]
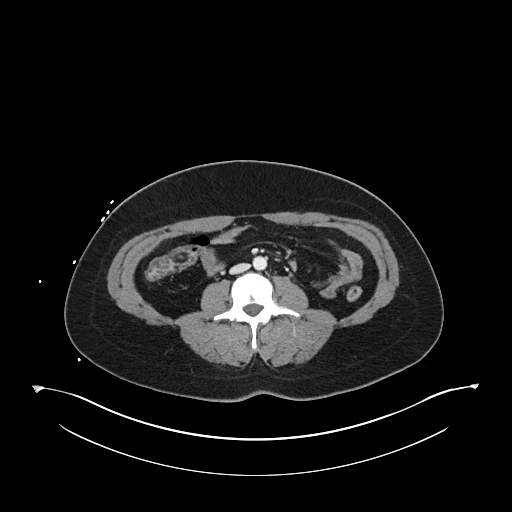
[im 65/102  soft-tissue]
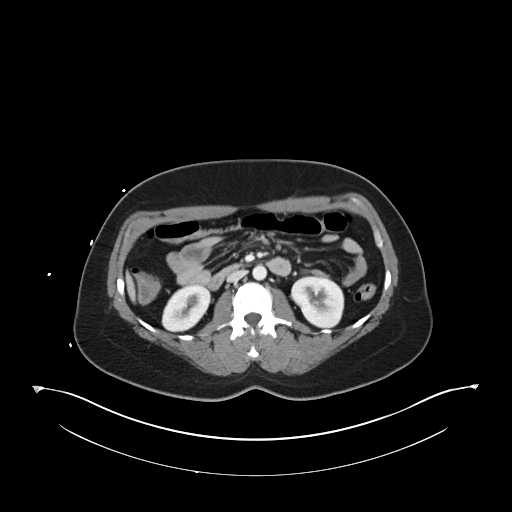
[im 65/102  bone]
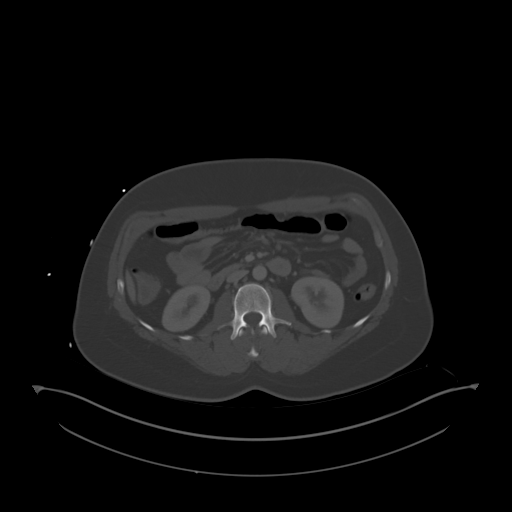
[im 73/102  soft-tissue]
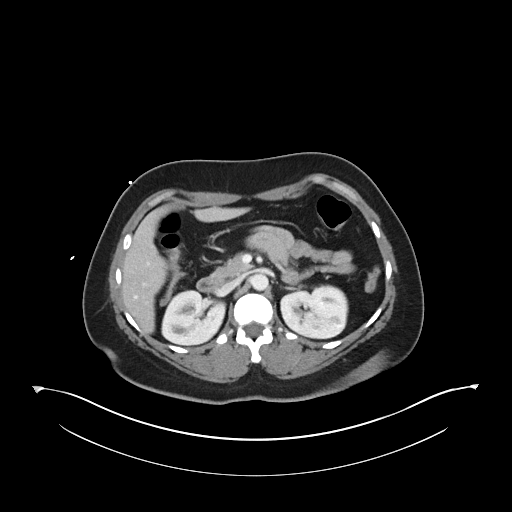
[im 81/102  soft-tissue]
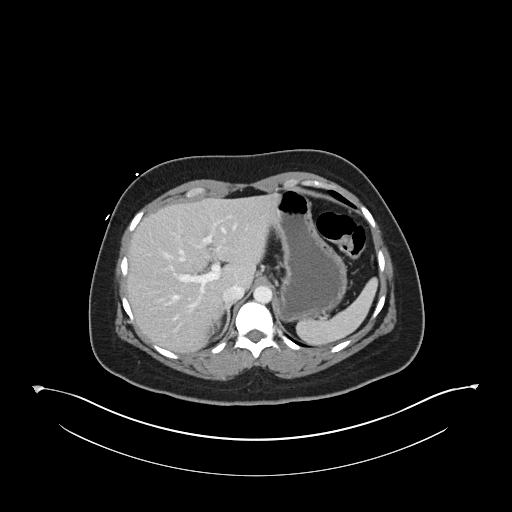
[im 89/102  soft-tissue]
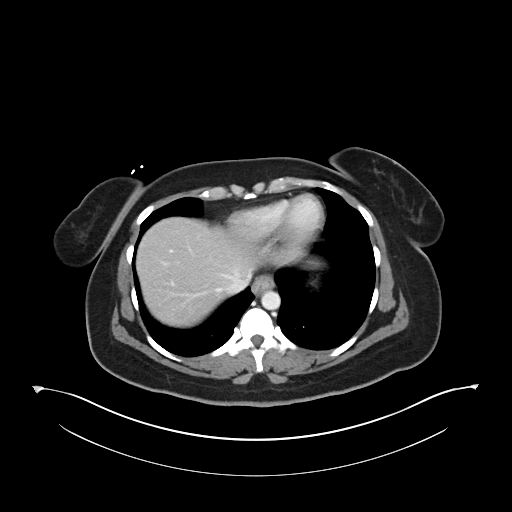
[im 97/102  soft-tissue]
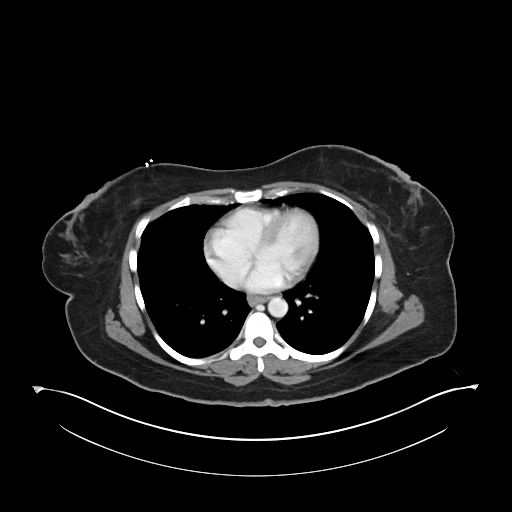

[Series 6: coronal soft tissue · coronal · 0.86mm/px · 3 of 101 slices shown]
[im 34/101  soft-tissue]
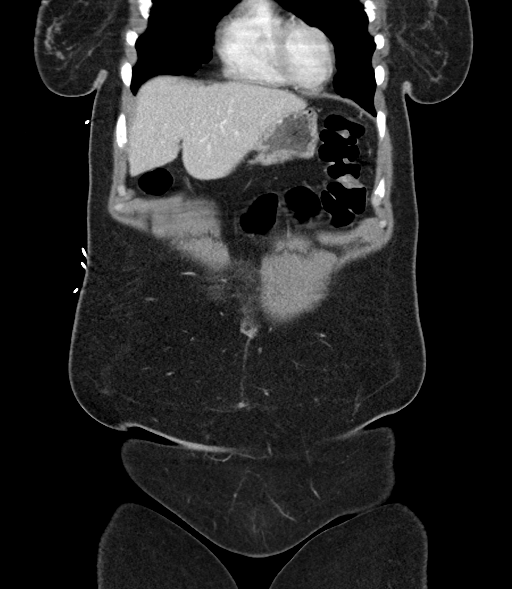
[im 45/101  soft-tissue]
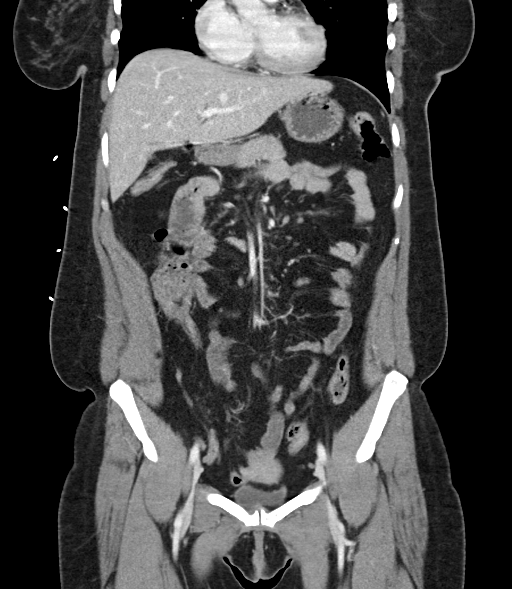
[im 56/101  soft-tissue]
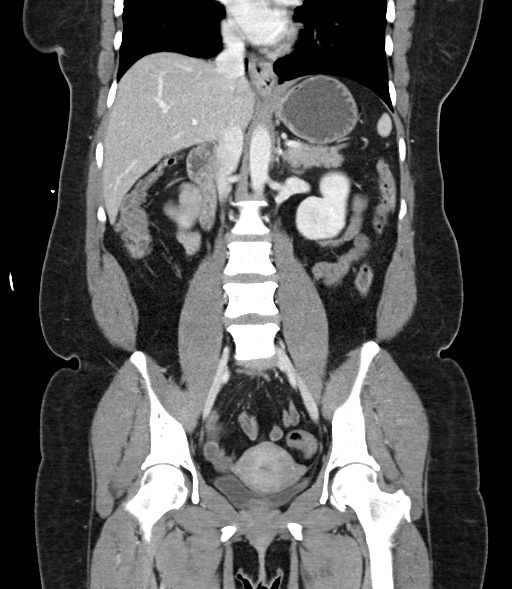

[16 of 46 positions shown; findings below may reference images not displayed]

FINDINGS: Lower chest: Lung bases are clear.  There is a small hiatal hernia.

Hepatobiliary: No focal liver lesions are appreciable. The
gallbladder is absent. There is no appreciable biliary duct
dilatation.

Pancreas: There is no pancreatic mass or inflammatory focus.

Spleen: No splenic lesions are evident.

Adrenals/Urinary Tract: Adrenals bilaterally appear normal. Kidneys
bilaterally show no evident mass or hydronephrosis on either side.
There is no evident renal or ureteral calculus on either side.
Urinary bladder is midline with wall thickness within normal limits.

Stomach/Bowel: There is no appreciable bowel wall or mesenteric
thickening. The terminal ileum appears unremarkable. There is mild
lipomatous infiltration of the ileocecal valve. There is no bowel
obstruction. There is no free air or portal venous air.

Vascular/Lymphatic: No abdominal aortic aneurysm. No arterial
vascular lesions evident. Major venous structures appear patent.
There is no evident adenopathy in the abdomen or pelvis.

Reproductive: Uterus anteverted.  No evident pelvic mass.

Other: Appendix appears normal. No abscess or ascites in the abdomen
or pelvis.

Musculoskeletal: No blastic or lytic bone lesions. No intramuscular
or abdominal wall lesions are evident.
IMPRESSION: 1. No appreciable bowel wall or mesenteric thickening. No evident
bowel obstruction. No abscess in the abdomen or pelvis. Appendix
appears normal.

2. No renal or ureteral calculus. No hydronephrosis. Urinary bladder
wall thickness normal.

3.  Small hiatal hernia.

4.  Gallbladder absent.

## 2022-01-05 IMAGING — CT CT ABD-PELV W/ CM
2 of 3 series · 15 of 46 positions shown, 17 images · IV contrast (Omni 300)
Comparison: December 25, 2019

CLINICAL DATA: Acute abdominal pain

EXAM:
CT ABDOMEN AND PELVIS WITH CONTRAST
TECHNIQUE: Multidetector CT imaging of the abdomen and pelvis was performed
using the standard protocol following bolus administration of
intravenous contrast.
CONTRAST:  100mL OMNIPAQUE IOHEXOL 300 MG/ML  SOLN

[Series 3: a/p w/ 5mm · axial · 0.93mm/px · z∈[+964,+1364]mm · 12 of 92 slices shown, 14 images]
[im 6/92  soft-tissue]
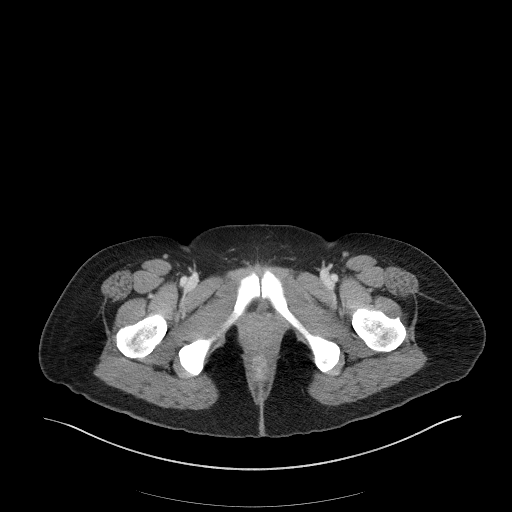
[im 6/92  bone]
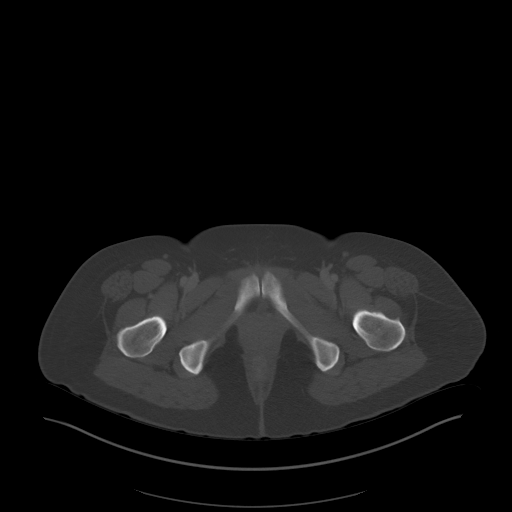
[im 12/92  soft-tissue]
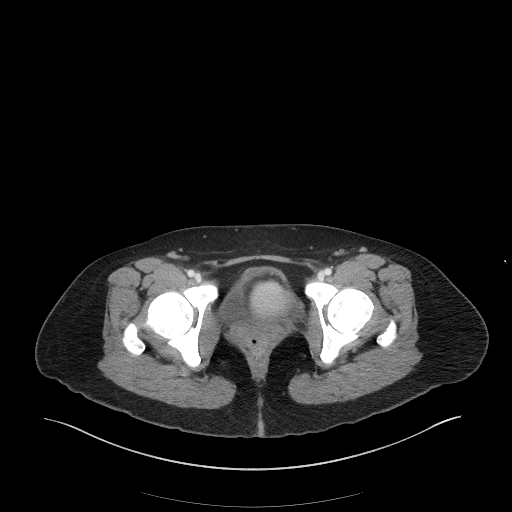
[im 21/92  soft-tissue]
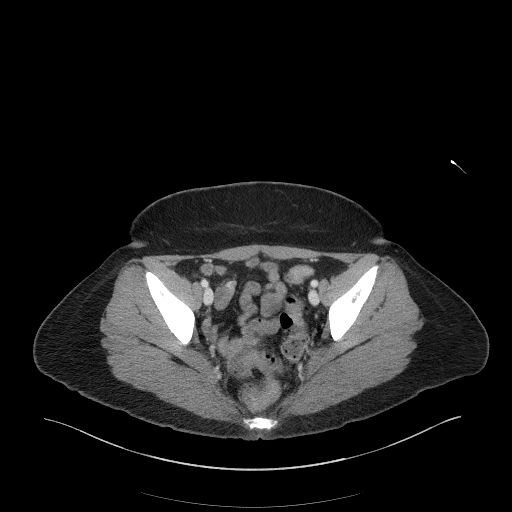
[im 27/92  soft-tissue]
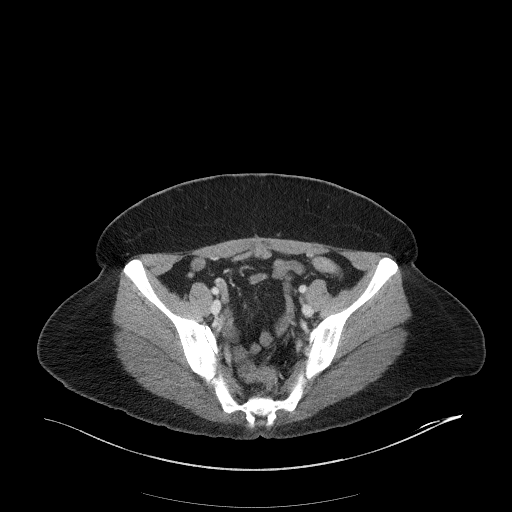
[im 36/92  soft-tissue]
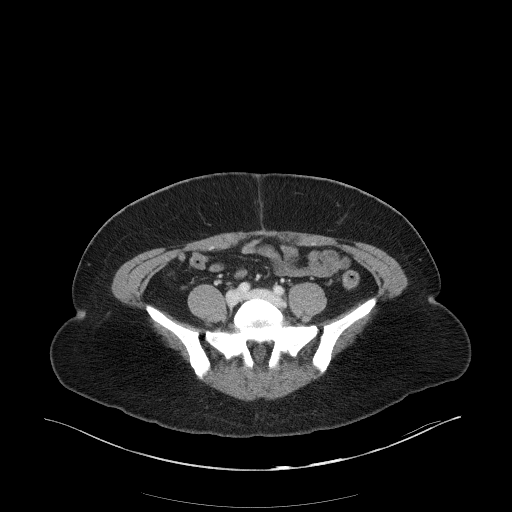
[im 42/92  soft-tissue]
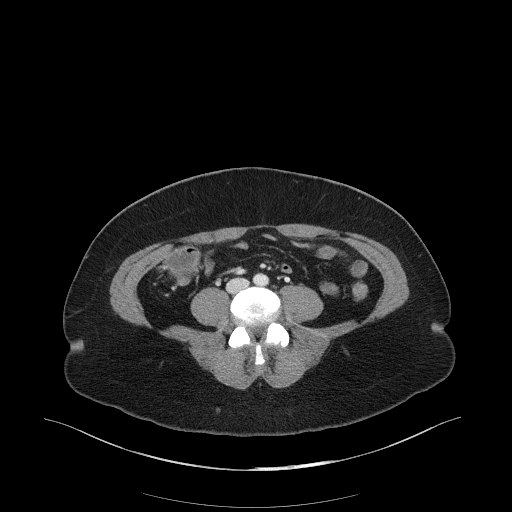
[im 50/92  soft-tissue]
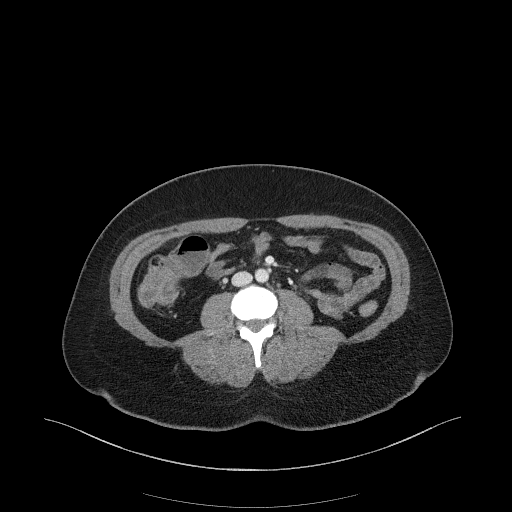
[im 56/92  soft-tissue]
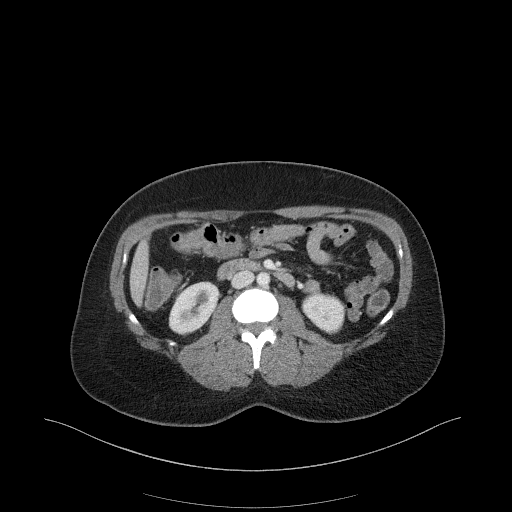
[im 65/92  soft-tissue]
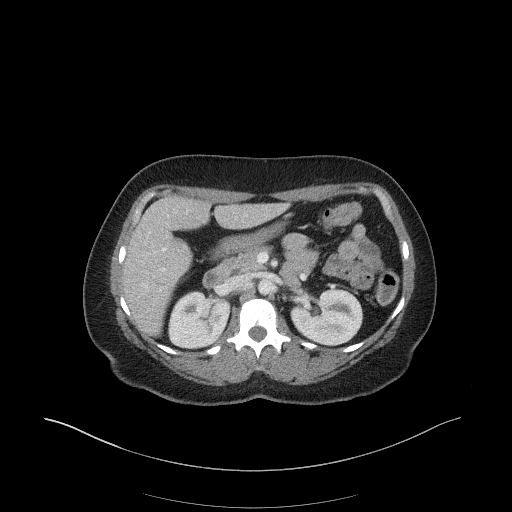
[im 65/92  bone]
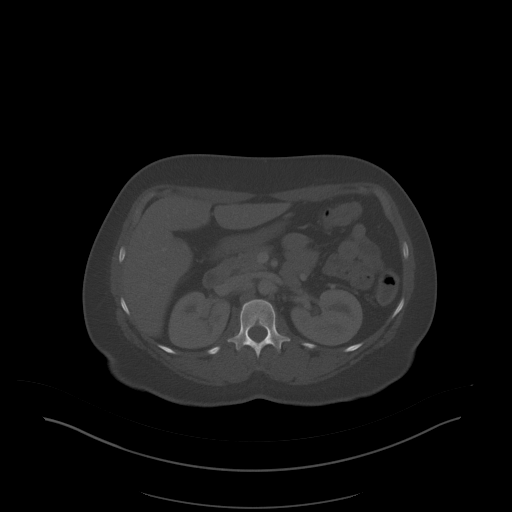
[im 71/92  soft-tissue]
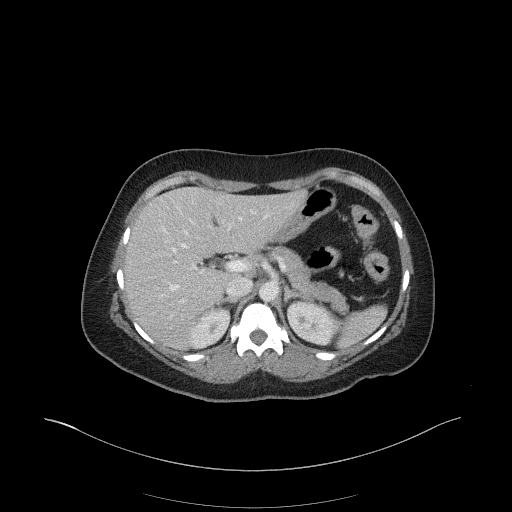
[im 80/92  soft-tissue]
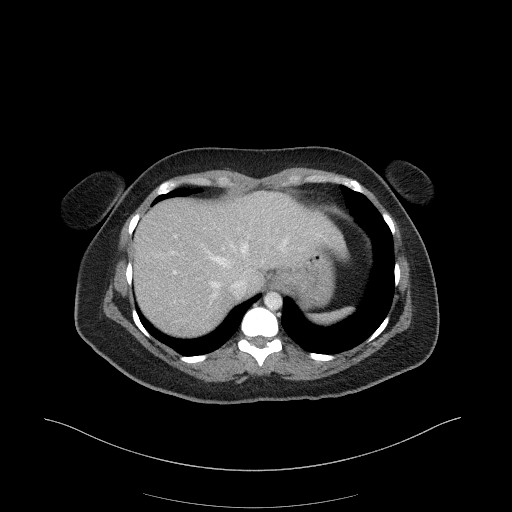
[im 86/92  soft-tissue]
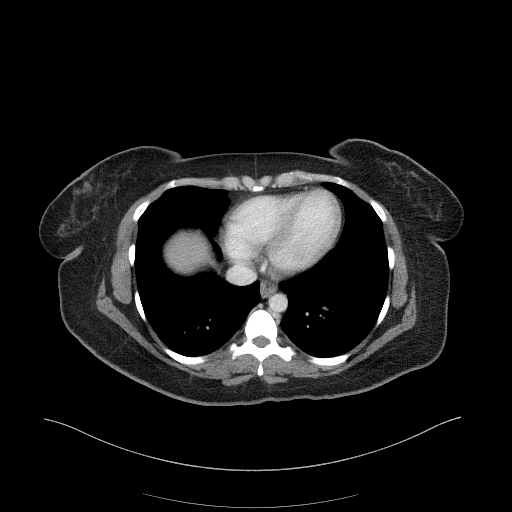

[Series 6: a/p w/ cor · coronal · 0.90mm/px · 3 of 151 slices shown]
[im 51/151  soft-tissue]
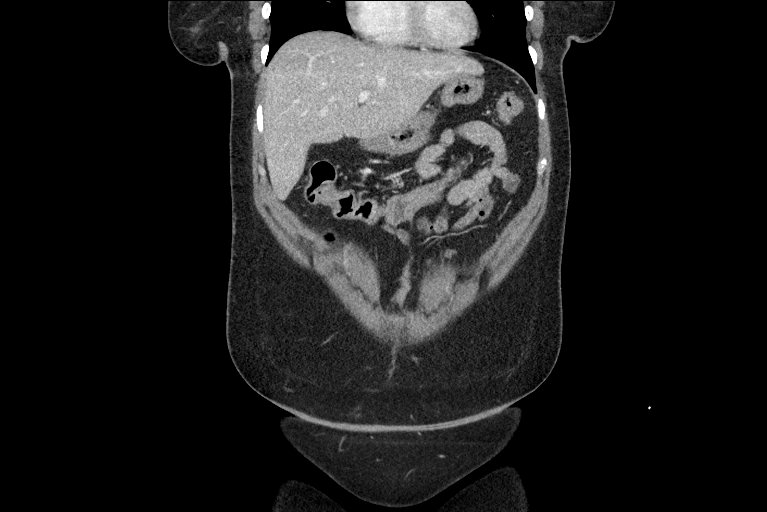
[im 67/151  soft-tissue]
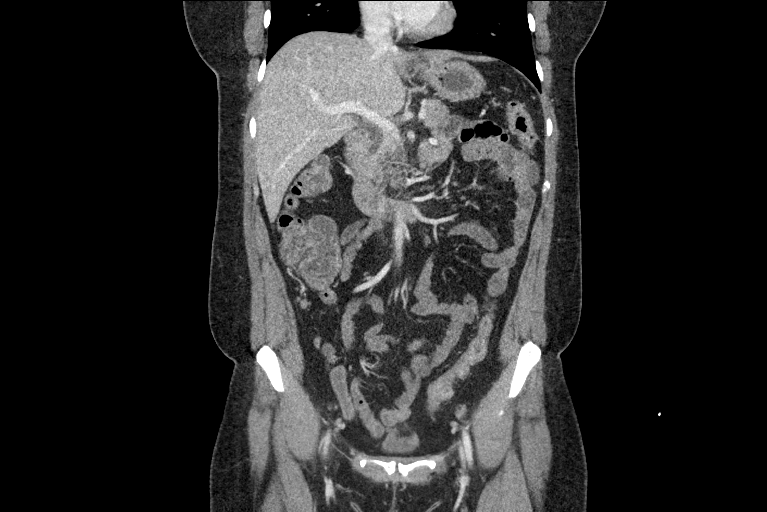
[im 84/151  soft-tissue]
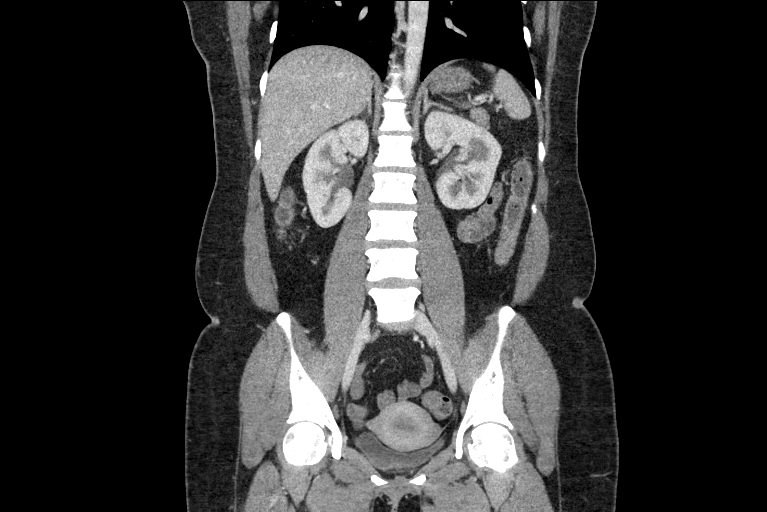

[15 of 46 positions shown; findings below may reference images not displayed]

FINDINGS: Lower chest: The visualized heart size within normal limits. No
pericardial fluid/thickening.

There is a small hiatal hernia.

The visualized portions of the lungs are clear.

Hepatobiliary: The liver is normal in density without focal
abnormality.The main portal vein is patent. The patient is status
post cholecystectomy. No biliary ductal dilation.

Pancreas: Unremarkable. No pancreatic ductal dilatation or
surrounding inflammatory changes.

Spleen: Normal in size without focal abnormality.

Adrenals/Urinary Tract: Both adrenal glands appear normal. The
kidneys and collecting system appear normal without evidence of
urinary tract calculus or hydronephrosis. Bladder is unremarkable.

Stomach/Bowel: The stomach, small bowel, and colon are normal in
appearance. No inflammatory changes, wall thickening, or obstructive
findings.The appendix is normal.

Vascular/Lymphatic: There are no enlarged mesenteric,
retroperitoneal, or pelvic lymph nodes. No significant vascular
findings are present.

Reproductive: The uterus and adnexa are unremarkable.

Other: No evidence of abdominal wall mass or hernia.

Musculoskeletal: No acute or significant osseous findings.
IMPRESSION: No acute intra-abdominal or pelvic pathology to explain the
patient's symptoms.

## 2022-10-26 ENCOUNTER — Other Ambulatory Visit: Payer: Self-pay

## 2022-10-26 ENCOUNTER — Ambulatory Visit (INDEPENDENT_AMBULATORY_CARE_PROVIDER_SITE_OTHER): Payer: Medicaid Other | Admitting: Family Medicine

## 2022-10-26 VITALS — BP 138/78 | Ht 64.0 in | Wt 180.0 lb

## 2022-10-26 DIAGNOSIS — M67431 Ganglion, right wrist: Secondary | ICD-10-CM

## 2022-10-26 DIAGNOSIS — M65831 Other synovitis and tenosynovitis, right forearm: Secondary | ICD-10-CM | POA: Diagnosis present

## 2022-10-26 DIAGNOSIS — R768 Other specified abnormal immunological findings in serum: Secondary | ICD-10-CM

## 2022-10-26 DIAGNOSIS — M25531 Pain in right wrist: Secondary | ICD-10-CM

## 2022-10-26 MED ORDER — PREDNISONE 5 MG PO TABS: ORAL_TABLET | ORAL | 0 refills | Status: AC

## 2022-10-26 NOTE — Assessment & Plan Note (Signed)
Acute on chronic in nature.  This was discovered a few months ago but has been unresolved -ANA, sed rate and CRP

## 2022-10-26 NOTE — Assessment & Plan Note (Signed)
Acute on chronic in nature.  X-rays been unrevealing.  Normal-appearing first dorsal compartment at the wrist.  Does have pain at the intersection. -Still on home exercise therapy and supportive care. -Prednisone. -Did have a previous positive ANA.  ANA, CRP and sed rate. -Could consider injection

## 2022-10-26 NOTE — Progress Notes (Signed)
  Amy Heath - 42 y.o. female MRN [id number]  Date of birth: [birth year]  SUBJECTIVE:  Including CC & ROS.  No chief complaint on file.   Amy Heath is a 42 y.o. female that is presenting with acute on chronic right wrist pain.  The pain has been ongoing for several months.  No injury or inciting event.  Has pain with any motion and movement.    Review of Systems See HPI   HISTORY: Past Medical, Surgical, Social, and Family History Reviewed & Updated per EMR.   Pertinent Historical Findings include:  Past Medical History:  Diagnosis Date   Adenomatous colon polyp    Chronic headaches    Chronic nausea    Chronic vomiting    Depression    IBS (irritable bowel syndrome)    Ovarian cyst    Pneumonia    Reflux esophagitis    Sickle cell trait (HCC)     Past Surgical History:  Procedure Laterality Date   COLONOSCOPY  2018   ESOPHAGOGASTRODUODENOSCOPY  2018   LAPAROSCOPIC CHOLECYSTECTOMY  2018   LAPAROSCOPY N/A 10/11/2012   Procedure: LAPAROSCOPY OPERATIVE;  Surgeon: Lesly Dukes, MD;  Location: [facility];  Service: Gynecology;  Laterality: N/A;   LAPAROTOMY N/A 10/11/2012   Procedure: EXPLORATORY LAPAROTOMY.  Removal of  Ectopic pregnancy.  ;  Surgeon: Lesly Dukes, MD;  Location: [facility];  Service: Gynecology;  Laterality: N/A;   LYSIS OF ADHESION N/A 10/11/2012   Procedure: LYSIS OF ADHESION;  Surgeon: Lesly Dukes, MD;  Location: [facility];  Service: Gynecology;  Laterality: N/A;   OVARIAN CYST SURGERY     UNILATERAL SALPINGECTOMY Right 10/11/2012   Procedure: UNILATERAL SALPINGECTOMY;  Surgeon: Lesly Dukes, MD;  Location: [facility];  Service: Gynecology;  Laterality: Right;     PHYSICAL EXAM:  VS: BP 138/78   Ht 5\' 4"  (1.626 m)   Wt 180 lb (81.6 kg)   BMI 30.90 kg/m  Physical Exam Gen: NAD, alert, cooperative with exam, well-appearing MSK:  Neurovascularly intact    Limited ultrasound: Right wrist pain:  Dorsal sided ganglion cyst appreciated. No changes  at the intersection. Normal-appearing median nerve.  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  X-rays been unrevealing.  Normal-appearing first dorsal compartment at the wrist.  Does have pain at the intersection. -Still on home exercise therapy and supportive care. -Prednisone. -Did have a previous positive ANA.  ANA, CRP and sed rate. -Could consider injection  Ganglion cyst of dorsum of right wrist Acutely appreciated.  Cyst appreciated on ultrasound -Counseled on supportive care. - Could consider aspiration  Positive ANA (antinuclear antibody) Acute on chronic in nature.  This was discovered a few months ago but has been unresolved -ANA, sed rate and CRP

## 2022-10-26 NOTE — Patient Instructions (Signed)
Nice to meet you Please try ice Please try the exercises  We'll call with the results from today   Please send me a message in MyChart with any questions or updates.  Please see me back in 2 weeks.   --Dr. Jordan Likes

## 2022-10-26 NOTE — Assessment & Plan Note (Signed)
Acutely appreciated.  Cyst appreciated on ultrasound -Counseled on supportive care. - Could consider aspiration

## 2022-10-27 LAB — C-REACTIVE PROTEIN: CRP: 10 mg/L (ref 0–10)

## 2022-10-27 LAB — SEDIMENTATION RATE: Sed Rate: 57 mm/hr — ABNORMAL HIGH (ref 0–32)

## 2022-10-27 LAB — ANA,IFA RA DIAG PNL W/RFLX TIT/PATN

## 2022-10-28 LAB — ANA,IFA RA DIAG PNL W/RFLX TIT/PATN: Rheumatoid fact SerPl-aCnc: 11.6 IU/mL (ref <14.0)

## 2022-10-29 ENCOUNTER — Telehealth: Payer: Self-pay | Admitting: Family Medicine

## 2022-10-29 NOTE — Telephone Encounter (Signed)
Left VM for patient. If she calls back please have her speak with a nurse/CMA and inform that her sed rate was elevated. This is a nonspecific inflammatory marker.  All the other lab work was normal.  We would need to do further testing or imaging to determine her source of pain.   If any questions then please take the best time and phone number to call and I will try to call her back.   Myra Rude, MD [facility] 10/29/2022, 1:29 PM

## 2022-11-02 ENCOUNTER — Encounter: Payer: Self-pay | Admitting: *Deleted

## 2022-11-09 ENCOUNTER — Encounter: Payer: Self-pay | Admitting: Family Medicine

## 2022-11-09 ENCOUNTER — Ambulatory Visit (INDEPENDENT_AMBULATORY_CARE_PROVIDER_SITE_OTHER): Payer: Medicaid Other | Admitting: Family Medicine

## 2022-11-09 ENCOUNTER — Other Ambulatory Visit: Payer: Self-pay

## 2022-11-09 VITALS — BP 140/90 | Ht 64.0 in | Wt 180.0 lb

## 2022-11-09 DIAGNOSIS — M67431 Ganglion, right wrist: Secondary | ICD-10-CM | POA: Diagnosis present

## 2022-11-09 DIAGNOSIS — M5412 Radiculopathy, cervical region: Secondary | ICD-10-CM | POA: Diagnosis not present

## 2022-11-09 MED ORDER — GABAPENTIN 100 MG PO CAPS: 100.0000 mg | ORAL_CAPSULE | Freq: Three times a day (TID) | ORAL | 1 refills | Status: AC

## 2022-11-09 NOTE — Assessment & Plan Note (Addendum)
Acutely occurring.  Symptoms seem most consistent with a radicular pain on the right side with altered sensation in the thumb and index finger. -Counseled on home exercise therapy and supportive care. -Gabapentin. -Could consider physical therapy or further imaging r

## 2022-11-09 NOTE — Patient Instructions (Signed)
Good to see you Please try heat on the neck and shoulder  Please try voltaren over the counter gel  Please start the gabapentin at night. You can increase this to 2 or 3 times daily as you tolerate.   Please send me a message in MyChart with any questions or updates.  Please see me back in 3 weeks.   --Dr. Jordan Likes

## 2022-11-09 NOTE — Assessment & Plan Note (Signed)
Acutely occurring. -Counseled on home exercise therapy and supportive care. -aspiration Today

## 2022-11-09 NOTE — Progress Notes (Signed)
Amy Heath - 43 y.o. female MRN [id number]  Date of birth: 05-Feb-1981  SUBJECTIVE:  Including CC & ROS.  No chief complaint on file.   Amy Heath is a 42 y.o. female that is presenting with ongoing ganglion cyst and right arm pain.  She continues to have pain in the trapezius as well as altered sensation in the right thumb and index finger..    Review of Systems See HPI   HISTORY: Past Medical, Surgical, Social, and Family History Reviewed & Updated per EMR.   Pertinent Historical Findings include:  Past Medical History:  Diagnosis Date   Adenomatous colon polyp    Chronic headaches    Chronic nausea    Chronic vomiting    Depression    IBS (irritable bowel syndrome)    Ovarian cyst    Pneumonia    Reflux esophagitis    Sickle cell trait     Past Surgical History:  Procedure Laterality Date   COLONOSCOPY  2018   ESOPHAGOGASTRODUODENOSCOPY  2018   LAPAROSCOPIC CHOLECYSTECTOMY  2018   LAPAROSCOPY N/A 10/11/2012   Procedure: LAPAROSCOPY OPERATIVE;  Surgeon: Lesly Dukes, MD;  Location: [facility];  Service: Gynecology;  Laterality: N/A;   LAPAROTOMY N/A 10/11/2012   Procedure: EXPLORATORY LAPAROTOMY.  Removal of  Ectopic pregnancy.  ;  Surgeon: Lesly Dukes, MD;  Location: [facility];  Service: Gynecology;  Laterality: N/A;   LYSIS OF ADHESION N/A 10/11/2012   Procedure: LYSIS OF ADHESION;  Surgeon: Lesly Dukes, MD;  Location: [facility];  Service: Gynecology;  Laterality: N/A;   OVARIAN CYST SURGERY     UNILATERAL SALPINGECTOMY Right 10/11/2012   Procedure: UNILATERAL SALPINGECTOMY;  Surgeon: Lesly Dukes, MD;  Location: [facility];  Service: Gynecology;  Laterality: Right;     PHYSICAL EXAM:  VS: BP (!) 140/90 (BP Location: Left Arm, Patient Position: Sitting)   Ht  (1.626 m)   Wt 180 lb (81.6 kg)   BMI 30.90 kg/m  Physical Exam Gen: NAD, alert, cooperative with exam, well-appearing MSK:  Neurovascularly intact    Limited ultrasound: Right wrist  pain:  Ganglion cyst appreciated on the dorsum of the wrist  Summary: Findings consistent with ganglion cyst  Ultrasound and interpretation by Clare Gandy, MD  Aspiration/Injection Procedure Note Amy Heath [birth year]  Procedure: Aspiration Indications: Right wrist pain  Procedure Details Consent: Risks of procedure as well as the alternatives and risks of each were explained to the (patient/caregiver).  Consent for procedure obtained. Time Out: Verified patient identification, verified procedure, site/side was marked, verified correct patient position, special equipment/implants available, medications/allergies/relevent history reviewed, required imaging and test results available.  Performed.  The area was cleaned with iodine and alcohol swabs.    The right dorsal ganglion cyst was injected using 3 cc of 1% lidocaine and 0.3 cc of 8.4% sodium bicarbonate on a 25-gauge 1-1/2 inch needle.  An 18-gauge 1-1/2 inch needle was used to achieve aspiration.  Ultrasound was used. Images were obtained in long views showing the injection.    Amount of Fluid Aspirated: minimal amount Character of Fluid: gelatinous Fluid was sent for: n/a  A sterile dressing was applied.  Patient did tolerate procedure well.     ASSESSMENT & PLAN:   Cervical radiculopathy Acutely occurring.  Symptoms seem most consistent with a radicular pain on the right side with altered sensation in the thumb and index finger. -Counseled on home exercise therapy and supportive care. -Gabapentin. -Could consider physical therapy or further  imaging r  Ganglion cyst of dorsum of right wrist Acutely occurring. -Counseled on home exercise therapy and supportive care. -aspiration Today

## 2022-11-24 ENCOUNTER — Other Ambulatory Visit: Payer: Self-pay | Admitting: Family Medicine

## 2022-11-24 DIAGNOSIS — M67431 Ganglion, right wrist: Secondary | ICD-10-CM

## 2022-12-01 ENCOUNTER — Ambulatory Visit: Payer: Medicaid Other | Admitting: Family Medicine

## 2022-12-10 ENCOUNTER — Ambulatory Visit: Payer: Medicaid Other | Admitting: Family Medicine
